# Patient Record
Sex: Female | Born: 1955
Health system: Southern US, Community
[De-identification: ages and names within clinical notes are randomized; demographics above are authoritative.]

## PROBLEM LIST (undated history)

## (undated) DIAGNOSIS — Z923 Personal history of irradiation: Secondary | ICD-10-CM

## (undated) DIAGNOSIS — C50919 Malignant neoplasm of unspecified site of unspecified female breast: Secondary | ICD-10-CM

## (undated) DIAGNOSIS — I10 Essential (primary) hypertension: Secondary | ICD-10-CM

## (undated) DIAGNOSIS — C801 Malignant (primary) neoplasm, unspecified: Secondary | ICD-10-CM

## (undated) DIAGNOSIS — M1990 Unspecified osteoarthritis, unspecified site: Secondary | ICD-10-CM

## (undated) DIAGNOSIS — IMO0002 Reserved for concepts with insufficient information to code with codable children: Secondary | ICD-10-CM

## (undated) HISTORY — DX: Reserved for concepts with insufficient information to code with codable children: IMO0002

## (undated) HISTORY — DX: Malignant (primary) neoplasm, unspecified: C80.1

## (undated) HISTORY — DX: Essential (primary) hypertension: I10

## (undated) HISTORY — PX: BREAST EXCISIONAL BIOPSY: SUR124

## (undated) HISTORY — PX: BREAST SURGERY: SHX581

## (undated) HISTORY — PX: COLPOSCOPY: SHX161

## (undated) HISTORY — DX: Unspecified osteoarthritis, unspecified site: M19.90

---

## 1997-03-10 DIAGNOSIS — Z923 Personal history of irradiation: Secondary | ICD-10-CM

## 1997-03-10 DIAGNOSIS — C801 Malignant (primary) neoplasm, unspecified: Secondary | ICD-10-CM

## 1997-03-10 DIAGNOSIS — C50919 Malignant neoplasm of unspecified site of unspecified female breast: Secondary | ICD-10-CM

## 1997-03-10 HISTORY — DX: Personal history of irradiation: Z92.3

## 1997-03-10 HISTORY — DX: Malignant (primary) neoplasm, unspecified: C80.1

## 1997-03-10 HISTORY — DX: Malignant neoplasm of unspecified site of unspecified female breast: C50.919

## 1997-12-22 ENCOUNTER — Encounter: Payer: Self-pay | Admitting: Surgery

## 1997-12-22 ENCOUNTER — Ambulatory Visit (HOSPITAL_COMMUNITY): Admission: RE | Admit: 1997-12-22 | Discharge: 1997-12-22 | Payer: Self-pay | Admitting: Surgery

## 1997-12-27 ENCOUNTER — Encounter: Admission: RE | Admit: 1997-12-27 | Discharge: 1998-03-27 | Payer: Self-pay | Admitting: Radiation Oncology

## 1998-05-16 ENCOUNTER — Ambulatory Visit (HOSPITAL_COMMUNITY): Admission: RE | Admit: 1998-05-16 | Discharge: 1998-05-16 | Payer: Self-pay | Admitting: Family Medicine

## 1998-12-19 ENCOUNTER — Encounter: Admission: RE | Admit: 1998-12-19 | Discharge: 1998-12-19 | Payer: Self-pay | Admitting: Family Medicine

## 1999-12-23 ENCOUNTER — Encounter: Payer: Self-pay | Admitting: Family Medicine

## 1999-12-23 ENCOUNTER — Encounter: Admission: RE | Admit: 1999-12-23 | Discharge: 1999-12-23 | Payer: Self-pay | Admitting: Family Medicine

## 2000-07-10 ENCOUNTER — Other Ambulatory Visit: Admission: RE | Admit: 2000-07-10 | Discharge: 2000-07-10 | Payer: Self-pay | Admitting: Family Medicine

## 2000-12-24 ENCOUNTER — Encounter: Payer: Self-pay | Admitting: Family Medicine

## 2000-12-24 ENCOUNTER — Encounter: Admission: RE | Admit: 2000-12-24 | Discharge: 2000-12-24 | Payer: Self-pay | Admitting: Family Medicine

## 2001-07-26 ENCOUNTER — Other Ambulatory Visit: Admission: RE | Admit: 2001-07-26 | Discharge: 2001-07-26 | Payer: Self-pay | Admitting: Family Medicine

## 2001-12-27 ENCOUNTER — Encounter: Payer: Self-pay | Admitting: Family Medicine

## 2001-12-27 ENCOUNTER — Encounter: Admission: RE | Admit: 2001-12-27 | Discharge: 2001-12-27 | Payer: Self-pay | Admitting: Family Medicine

## 2002-08-01 ENCOUNTER — Other Ambulatory Visit: Admission: RE | Admit: 2002-08-01 | Discharge: 2002-08-01 | Payer: Self-pay | Admitting: Family Medicine

## 2002-12-29 ENCOUNTER — Encounter: Admission: RE | Admit: 2002-12-29 | Discharge: 2002-12-29 | Payer: Self-pay | Admitting: Family Medicine

## 2002-12-29 ENCOUNTER — Encounter: Payer: Self-pay | Admitting: Family Medicine

## 2003-09-19 ENCOUNTER — Other Ambulatory Visit: Admission: RE | Admit: 2003-09-19 | Discharge: 2003-09-19 | Payer: Self-pay | Admitting: Family Medicine

## 2004-01-02 ENCOUNTER — Encounter: Admission: RE | Admit: 2004-01-02 | Discharge: 2004-01-02 | Payer: Self-pay | Admitting: Family Medicine

## 2004-12-16 ENCOUNTER — Other Ambulatory Visit: Admission: RE | Admit: 2004-12-16 | Discharge: 2004-12-16 | Payer: Self-pay | Admitting: Family Medicine

## 2005-01-06 ENCOUNTER — Encounter: Admission: RE | Admit: 2005-01-06 | Discharge: 2005-01-06 | Payer: Self-pay | Admitting: Family Medicine

## 2005-08-08 ENCOUNTER — Other Ambulatory Visit: Admission: RE | Admit: 2005-08-08 | Discharge: 2005-08-08 | Payer: Self-pay | Admitting: Family Medicine

## 2005-11-24 ENCOUNTER — Other Ambulatory Visit: Admission: RE | Admit: 2005-11-24 | Discharge: 2005-11-24 | Payer: Self-pay | Admitting: Family Medicine

## 2006-01-09 ENCOUNTER — Encounter: Admission: RE | Admit: 2006-01-09 | Discharge: 2006-01-09 | Payer: Self-pay | Admitting: Family Medicine

## 2006-06-29 ENCOUNTER — Other Ambulatory Visit: Admission: RE | Admit: 2006-06-29 | Discharge: 2006-06-29 | Payer: Self-pay | Admitting: Gynecology

## 2006-09-08 DIAGNOSIS — IMO0002 Reserved for concepts with insufficient information to code with codable children: Secondary | ICD-10-CM

## 2006-09-08 HISTORY — DX: Reserved for concepts with insufficient information to code with codable children: IMO0002

## 2006-12-23 ENCOUNTER — Other Ambulatory Visit: Admission: RE | Admit: 2006-12-23 | Discharge: 2006-12-23 | Payer: Self-pay | Admitting: Gynecology

## 2007-01-20 ENCOUNTER — Encounter: Admission: RE | Admit: 2007-01-20 | Discharge: 2007-01-20 | Payer: Self-pay | Admitting: Family Medicine

## 2007-06-17 ENCOUNTER — Ambulatory Visit: Payer: Self-pay | Admitting: Internal Medicine

## 2007-07-01 ENCOUNTER — Ambulatory Visit: Payer: Self-pay | Admitting: Internal Medicine

## 2007-07-28 ENCOUNTER — Other Ambulatory Visit: Admission: RE | Admit: 2007-07-28 | Discharge: 2007-07-28 | Payer: Self-pay | Admitting: Gynecology

## 2008-01-21 ENCOUNTER — Encounter: Admission: RE | Admit: 2008-01-21 | Discharge: 2008-01-21 | Payer: Self-pay | Admitting: Family Medicine

## 2008-02-01 ENCOUNTER — Encounter: Payer: Self-pay | Admitting: Gynecology

## 2008-02-01 ENCOUNTER — Other Ambulatory Visit: Admission: RE | Admit: 2008-02-01 | Discharge: 2008-02-01 | Payer: Self-pay | Admitting: Gynecology

## 2008-02-01 ENCOUNTER — Ambulatory Visit: Payer: Self-pay | Admitting: Gynecology

## 2009-01-22 ENCOUNTER — Encounter: Admission: RE | Admit: 2009-01-22 | Discharge: 2009-01-22 | Payer: Self-pay | Admitting: Internal Medicine

## 2009-05-14 ENCOUNTER — Ambulatory Visit: Payer: Self-pay | Admitting: Gynecology

## 2009-05-14 ENCOUNTER — Other Ambulatory Visit: Admission: RE | Admit: 2009-05-14 | Discharge: 2009-05-14 | Payer: Self-pay | Admitting: Gynecology

## 2010-01-07 ENCOUNTER — Ambulatory Visit: Payer: Self-pay | Admitting: Gynecology

## 2010-01-28 ENCOUNTER — Encounter: Admission: RE | Admit: 2010-01-28 | Discharge: 2010-01-28 | Payer: Self-pay | Admitting: Internal Medicine

## 2010-05-16 ENCOUNTER — Encounter (INDEPENDENT_AMBULATORY_CARE_PROVIDER_SITE_OTHER): Payer: BC Managed Care – PPO | Admitting: Gynecology

## 2010-05-16 ENCOUNTER — Other Ambulatory Visit: Payer: Self-pay | Admitting: Gynecology

## 2010-05-16 ENCOUNTER — Other Ambulatory Visit (HOSPITAL_COMMUNITY)
Admission: RE | Admit: 2010-05-16 | Discharge: 2010-05-16 | Disposition: A | Discharge status: Home / Self Care | Payer: BC Managed Care – PPO | Source: Ambulatory Visit | Attending: Gynecology | Admitting: Gynecology

## 2010-05-16 DIAGNOSIS — Z124 Encounter for screening for malignant neoplasm of cervix: Secondary | ICD-10-CM | POA: Insufficient documentation

## 2010-05-16 DIAGNOSIS — Z01419 Encounter for gynecological examination (general) (routine) without abnormal findings: Secondary | ICD-10-CM

## 2010-10-11 ENCOUNTER — Encounter: Payer: Self-pay | Admitting: *Deleted

## 2010-10-11 ENCOUNTER — Telehealth: Payer: Self-pay | Admitting: *Deleted

## 2010-10-11 NOTE — Telephone Encounter (Signed)
PT C/O UTI. PT INFORMED SHE NEED TO MAKE APPOINTMENT TO SEE MD.

## 2010-10-14 ENCOUNTER — Ambulatory Visit: Payer: BC Managed Care – PPO | Admitting: Gynecology

## 2010-12-30 ENCOUNTER — Other Ambulatory Visit: Payer: Self-pay | Admitting: Internal Medicine

## 2010-12-30 DIAGNOSIS — Z1231 Encounter for screening mammogram for malignant neoplasm of breast: Secondary | ICD-10-CM

## 2011-02-03 ENCOUNTER — Ambulatory Visit
Admission: RE | Admit: 2011-02-03 | Discharge: 2011-02-03 | Disposition: A | Discharge status: Home / Self Care | Payer: BC Managed Care – PPO | Source: Ambulatory Visit | Attending: Internal Medicine | Admitting: Internal Medicine

## 2011-02-03 DIAGNOSIS — Z1231 Encounter for screening mammogram for malignant neoplasm of breast: Secondary | ICD-10-CM

## 2011-05-26 ENCOUNTER — Encounter: Payer: Self-pay | Admitting: Gynecology

## 2011-05-26 ENCOUNTER — Encounter: Payer: BC Managed Care – PPO | Admitting: Gynecology

## 2011-05-26 ENCOUNTER — Other Ambulatory Visit (HOSPITAL_COMMUNITY)
Admission: RE | Admit: 2011-05-26 | Discharge: 2011-05-26 | Disposition: A | Discharge status: Home / Self Care | Payer: BC Managed Care – PPO | Source: Ambulatory Visit | Attending: Gynecology | Admitting: Gynecology

## 2011-05-26 ENCOUNTER — Ambulatory Visit (INDEPENDENT_AMBULATORY_CARE_PROVIDER_SITE_OTHER): Payer: BC Managed Care – PPO | Admitting: Gynecology

## 2011-05-26 VITALS — BP 112/74 | Ht 61.5 in | Wt 158.0 lb

## 2011-05-26 DIAGNOSIS — Z01419 Encounter for gynecological examination (general) (routine) without abnormal findings: Secondary | ICD-10-CM | POA: Insufficient documentation

## 2011-05-26 DIAGNOSIS — I10 Essential (primary) hypertension: Secondary | ICD-10-CM | POA: Insufficient documentation

## 2011-05-26 DIAGNOSIS — Z780 Asymptomatic menopausal state: Secondary | ICD-10-CM

## 2011-05-26 DIAGNOSIS — IMO0002 Reserved for concepts with insufficient information to code with codable children: Secondary | ICD-10-CM | POA: Insufficient documentation

## 2011-05-26 NOTE — Patient Instructions (Signed)
Follow up for bone density study as scheduled. Follow up in one year for your annual gynecologic exam.

## 2011-05-26 NOTE — Progress Notes (Signed)
Carrie Vaughn [birth year] [id number]        56 y.o.  for annual exam.  Several issues noted below.  Past medical history,surgical history, medications, allergies, family history and social history were all reviewed and documented in the EPIC chart. ROS:  Was performed and pertinent positives and negatives are included in the history.  Exam: Sherrilyn Rist chaperone present Filed Vitals:   05/26/11 1054  BP: 112/74   General appearance  Normal Skin grossly normal Head/Neck normal with no cervical or supraclavicular adenopathy thyroid normal Lungs  clear Cardiac RR, without RMG Abdominal  soft, nontender, without masses, organomegaly or hernia Breasts  examined lying and sitting without masses, retractions, discharge or axillary adenopathy. Pelvic  Ext/BUS/vagina  normal with atrophic genital changes  Cervix  normal  Pap done  Uterus  anteverted, normal size, shape and contour, midline and mobile nontender   Adnexa  Without masses or tenderness    Anus and perineum  normal   Rectovaginal  normal sphincter tone without palpated masses or tenderness.    Assessment/Plan:  56 y.o. female for annual exam.    1. Postmenopausal state. Patient is doing well no bleeding no significant menopausal symptoms. 2. History low-grade dysplasia. Patient had Pap smear showing low-grade SIL October 2008. Follow up colposcopy showed cervicitis with benign endocervical mucosa. Her follow up Pap smears in 2009 2011 2012 normal with a total of 4 normal Pap smears in a row.  Pap was done today. If normal will and continue annual cytology for now. May go to less frequent screening interval but will readdress on annual basis. 3. History of breast cancer. Mammography November 2012. She'll continue with annual mammography. SBE monthly reviewed. 4. Colonoscopy. She had her colonoscopy 4 years ago will follow up per their recommended schedule. 5. Bone density. Patient's never had a bone density I schedule a baseline now.  Increase calcium vitamin D reviewed. 6. Health maintenance. No blood work was done today as it's all been recently done through her primary physician's office who she sees on a regular basis.    Dara Lords MD, 11:15 AM 05/26/2011

## 2012-01-02 ENCOUNTER — Other Ambulatory Visit: Payer: Self-pay | Admitting: Internal Medicine

## 2012-01-02 DIAGNOSIS — Z1231 Encounter for screening mammogram for malignant neoplasm of breast: Secondary | ICD-10-CM

## 2012-02-16 ENCOUNTER — Ambulatory Visit
Admission: RE | Admit: 2012-02-16 | Discharge: 2012-02-16 | Disposition: A | Discharge status: Home / Self Care | Payer: BC Managed Care – PPO | Source: Ambulatory Visit | Attending: Internal Medicine | Admitting: Internal Medicine

## 2012-02-16 DIAGNOSIS — Z1231 Encounter for screening mammogram for malignant neoplasm of breast: Secondary | ICD-10-CM

## 2012-05-26 ENCOUNTER — Encounter: Payer: Self-pay | Admitting: Gynecology

## 2012-05-26 ENCOUNTER — Ambulatory Visit (INDEPENDENT_AMBULATORY_CARE_PROVIDER_SITE_OTHER): Payer: BC Managed Care – PPO | Admitting: Gynecology

## 2012-05-26 VITALS — BP 126/80 | Ht 61.0 in | Wt 155.0 lb

## 2012-05-26 DIAGNOSIS — Z780 Asymptomatic menopausal state: Secondary | ICD-10-CM

## 2012-05-26 NOTE — Progress Notes (Signed)
Carrie Vaughn 11/27/1955 [id number]        57 y.o.  G1P1001 for annual exam.  Doing well without complaints.  Past medical history,surgical history, medications, allergies, family history and social history were all reviewed and documented in the EPIC chart. ROS:  Was performed and pertinent positives and negatives are included in the history.  Exam: [name] assistant Filed Vitals:   05/26/12 1559  BP: 126/80  Height: 5\' 1"  (1.549 m)  Weight: 155 lb (70.308 kg)   General appearance  Normal Skin grossly normal Head/Neck normal with no cervical or supraclavicular adenopathy thyroid normal Lungs  clear Cardiac RR, without RMG Abdominal  soft, nontender, without masses, organomegaly or hernia Breasts  examined lying and sitting without masses, retractions, discharge or axillary adenopathy. Well-healed right lumpectomy scar. Pelvic  Ext/BUS/vagina  normal with mild atrophic changes  Cervix  normal   Uterus  anteverted, normal size, shape and contour, midline and mobile nontender   Adnexa  Without masses or tenderness    Anus and perineum  normal   Rectovaginal  normal sphincter tone without palpated masses or tenderness.    Assessment/Plan:  57 y.o. G42P1001 female for annual exam.   1. Postmenopausal. Doing well without significant symptoms such as hot flashes, night sweats vaginal dryness dyspareunia. Continue to monitor. 2. History of breast cancer. Doing well. Mammography 02/2012 normal. Continue with a no mammography. SBE monthly reviewed. 3. Pap smear 2013. No Pap smear done today.  Patient had Pap smear showing low-grade SIL October 2008. Follow up colposcopy showed cervicitis with benign endocervical mucosa. Had follow up Pap smears in 2009 2011 2012 2013 normal with a total of 5 normal Pap smears in a row. Plan every 3 year screening.  4. DEXA. Never followed up as recommended last year. Asked her to schedule baseline again this year and she agrees to do so. Increase calcium  vitamin D reviewed. 5. Colonoscopy 5 years ago. Plan repeat at 10-year interval. 6. Health maintenance. No blood work done as it is all done through her primary physician's office. Followup for DEXA otherwise 1 year, sooner as needed   Dara Lords MD, 4:37 PM 05/26/2012

## 2012-05-26 NOTE — Patient Instructions (Signed)
Followup for bone density as scheduled. Followup in one year for annual exam 

## 2013-01-12 ENCOUNTER — Other Ambulatory Visit: Payer: Self-pay

## 2013-01-12 DIAGNOSIS — Z1231 Encounter for screening mammogram for malignant neoplasm of breast: Secondary | ICD-10-CM

## 2013-02-16 ENCOUNTER — Ambulatory Visit
Admission: RE | Admit: 2013-02-16 | Discharge: 2013-02-16 | Disposition: A | Discharge status: Home / Self Care | Payer: BC Managed Care – PPO | Source: Ambulatory Visit

## 2013-02-16 DIAGNOSIS — Z1231 Encounter for screening mammogram for malignant neoplasm of breast: Secondary | ICD-10-CM

## 2013-05-27 ENCOUNTER — Encounter: Payer: BC Managed Care – PPO | Admitting: Gynecology

## 2013-07-06 ENCOUNTER — Ambulatory Visit (INDEPENDENT_AMBULATORY_CARE_PROVIDER_SITE_OTHER): Payer: BC Managed Care – PPO | Admitting: Gynecology

## 2013-07-06 ENCOUNTER — Encounter: Payer: Self-pay | Admitting: Gynecology

## 2013-07-06 VITALS — BP 120/76 | Ht 62.0 in | Wt 152.0 lb

## 2013-07-06 DIAGNOSIS — Z01419 Encounter for gynecological examination (general) (routine) without abnormal findings: Secondary | ICD-10-CM

## 2013-07-06 DIAGNOSIS — N952 Postmenopausal atrophic vaginitis: Secondary | ICD-10-CM

## 2013-07-06 NOTE — Patient Instructions (Signed)
Followup in one year, sooner as needed. Report any vaginal bleeding.  Health Maintenance, Female A healthy lifestyle and preventative care can promote health and wellness.  Maintain regular health, dental, and eye exams.  Eat a healthy diet. Foods like vegetables, fruits, whole grains, low-fat dairy products, and lean protein foods contain the nutrients you need without too many calories. Decrease your intake of foods high in solid fats, added sugars, and salt. Get information about a proper diet from your caregiver, if necessary.  Regular physical exercise is one of the most important things you can do for your health. Most adults should get at least 150 minutes of moderate-intensity exercise (any activity that increases your heart rate and causes you to sweat) each week. In addition, most adults need muscle-strengthening exercises on 2 or more days a week.   Maintain a healthy weight. The body mass index (BMI) is a screening tool to identify possible weight problems. It provides an estimate of body fat based on height and weight. Your caregiver can help determine your BMI, and can help you achieve or maintain a healthy weight. For adults 20 years and older:  A BMI below 18.5 is considered underweight.  A BMI of 18.5 to 24.9 is normal.  A BMI of 25 to 29.9 is considered overweight.  A BMI of 30 and above is considered obese.  Maintain normal blood lipids and cholesterol by exercising and minimizing your intake of saturated fat. Eat a balanced diet with plenty of fruits and vegetables. Blood tests for lipids and cholesterol should begin at age 47 and be repeated every 5 years. If your lipid or cholesterol levels are high, you are over 50, or you are a high risk for heart disease, you may need your cholesterol levels checked more frequently.Ongoing high lipid and cholesterol levels should be treated with medicines if diet and exercise are not effective.  If you smoke, find out from your  caregiver how to quit. If you do not use tobacco, do not start.  Lung cancer screening is recommended for adults aged 35 80 years who are at high risk for developing lung cancer because of a history of smoking. Yearly low-dose computed tomography (CT) is recommended for people who have at least a 30-pack-year history of smoking and are a current smoker or have quit within the past 15 years. A pack year of smoking is smoking an average of 1 pack of cigarettes a day for 1 year (for example: 1 pack a day for 30 years or 2 packs a day for 15 years). Yearly screening should continue until the smoker has stopped smoking for at least 15 years. Yearly screening should also be stopped for people who develop a health problem that would prevent them from having lung cancer treatment.  If you are pregnant, do not drink alcohol. If you are breastfeeding, be very cautious about drinking alcohol. If you are not pregnant and choose to drink alcohol, do not exceed 1 drink per day. One drink is considered to be 12 ounces (355 mL) of beer, 5 ounces (148 mL) of wine, or 1.5 ounces (44 mL) of liquor.  Avoid use of street drugs. Do not share needles with anyone. Ask for help if you need support or instructions about stopping the use of drugs.  High blood pressure causes heart disease and increases the risk of stroke. Blood pressure should be checked at least every 1 to 2 years. Ongoing high blood pressure should be treated with medicines, if weight  loss and exercise are not effective.  If you are 37 to 57 years old, ask your caregiver if you should take aspirin to prevent strokes.  Diabetes screening involves taking a blood sample to check your fasting blood sugar level. This should be done once every 3 years, after age 56, if you are within normal weight and without risk factors for diabetes. Testing should be considered at a younger age or be carried out more frequently if you are overweight and have at least 1 risk factor  for diabetes.  Breast cancer screening is essential preventative care for women. You should practice "breast self-awareness." This means understanding the normal appearance and feel of your breasts and may include breast self-examination. Any changes detected, no matter how small, should be reported to a caregiver. Women in their 51s and 30s should have a clinical breast exam (CBE) by a caregiver as part of a regular health exam every 1 to 3 years. After age 59, women should have a CBE every year. Starting at age 61, women should consider having a mammogram (breast X-ray) every year. Women who have a family history of breast cancer should talk to their caregiver about genetic screening. Women at a high risk of breast cancer should talk to their caregiver about having an MRI and a mammogram every year.  Breast cancer gene (BRCA)-related cancer risk assessment is recommended for women who have family members with BRCA-related cancers. BRCA-related cancers include breast, ovarian, tubal, and peritoneal cancers. Having family members with these cancers may be associated with an increased risk for harmful changes (mutations) in the breast cancer genes BRCA1 and BRCA2. Results of the assessment will determine the need for genetic counseling and BRCA1 and BRCA2 testing.  The Pap test is a screening test for cervical cancer. Women should have a Pap test starting at age 88. Between ages 43 and 24, Pap tests should be repeated every 2 years. Beginning at age 34, you should have a Pap test every 3 years as long as the past 3 Pap tests have been normal. If you had a hysterectomy for a problem that was not cancer or a condition that could lead to cancer, then you no longer need Pap tests. If you are between ages 93 and 29, and you have had normal Pap tests going back 10 years, you no longer need Pap tests. If you have had past treatment for cervical cancer or a condition that could lead to cancer, you need Pap tests and  screening for cancer for at least 20 years after your treatment. If Pap tests have been discontinued, risk factors (such as a new sexual partner) need to be reassessed to determine if screening should be resumed. Some women have medical problems that increase the chance of getting cervical cancer. In these cases, your caregiver may recommend more frequent screening and Pap tests.  The human papillomavirus (HPV) test is an additional test that may be used for cervical cancer screening. The HPV test looks for the virus that can cause the cell changes on the cervix. The cells collected during the Pap test can be tested for HPV. The HPV test could be used to screen women aged 34 years and older, and should be used in women of any age who have unclear Pap test results. After the age of 24, women should have HPV testing at the same frequency as a Pap test.  Colorectal cancer can be detected and often prevented. Most routine colorectal cancer screening begins at  the age of 41 and continues through age 76. However, your caregiver may recommend screening at an earlier age if you have risk factors for colon cancer. On a yearly basis, your caregiver may provide home test kits to check for hidden blood in the stool. Use of a small camera at the end of a tube, to directly examine the colon (sigmoidoscopy or colonoscopy), can detect the earliest forms of colorectal cancer. Talk to your caregiver about this at age 17, when routine screening begins. Direct examination of the colon should be repeated every 5 to 10 years through age 13, unless early forms of pre-cancerous polyps or small growths are found.  Hepatitis C blood testing is recommended for all people born from [birth year] through 1964 and any individual with known risks for hepatitis C.  Practice safe sex. Use condoms and avoid high-risk sexual practices to reduce the spread of sexually transmitted infections (STIs). Sexually active women aged 55 and younger should be  checked for Chlamydia, which is a common sexually transmitted infection. Older women with new or multiple partners should also be tested for Chlamydia. Testing for other STIs is recommended if you are sexually active and at increased risk.  Osteoporosis is a disease in which the bones lose minerals and strength with aging. This can result in serious bone fractures. The risk of osteoporosis can be identified using a bone density scan. Women ages 66 and over and women at risk for fractures or osteoporosis should discuss screening with their caregivers. Ask your caregiver whether you should be taking a calcium supplement or vitamin D to reduce the rate of osteoporosis.  Menopause can be associated with physical symptoms and risks. Hormone replacement therapy is available to decrease symptoms and risks. You should talk to your caregiver about whether hormone replacement therapy is right for you.  Use sunscreen. Apply sunscreen liberally and repeatedly throughout the day. You should seek shade when your shadow is shorter than you. Protect yourself by wearing long sleeves, pants, a wide-brimmed hat, and sunglasses year round, whenever you are outdoors.  Notify your caregiver of new moles or changes in moles, especially if there is a change in shape or color. Also notify your caregiver if a mole is larger than the size of a pencil eraser.  Stay current with your immunizations. Document Released: 09/09/2010 Document Revised: 06/21/2012 Document Reviewed: 09/09/2010 [facility] Patient Information 2014 [employer].

## 2013-07-06 NOTE — Progress Notes (Signed)
Carrie Vaughn 1955-06-30 [id number]        58 y.o.  G1P1001 for annual exam.  Doing well. Several issues noted below.  Past medical history,surgical history, problem list, medications, allergies, family history and social history were all reviewed and documented as reviewed in the EPIC chart.  ROS:  12 system ROS performed with pertinent positives and negatives included in the history, assessment and plan.  Included Systems: General, HEENT, Neck, Cardiovascular, Pulmonary, Gastrointestinal, Genitourinary, Musculoskeletal, Dermatologic, Endocrine, Hematological, Neurologic, Psychiatric Additional significant findings : None   Exam: [name] assistant Filed Vitals:   07/06/13 1529  BP: 120/76  Height: 5\' 2"  (1.575 m)  Weight: 152 lb (68.947 kg)   General appearance:  Normal affect, orientation and appearance. Skin: Grossly normal HEENT: Without gross lesions.  No cervical or supraclavicular adenopathy. Thyroid normal.  Lungs:  Clear without wheezing, rales or rhonchi Cardiac: RR, without RMG Abdominal:  Soft, nontender, without masses, guarding, rebound, organomegaly or hernia Breasts:  Examined lying and sitting without masses, retractions, discharge or axillary adenopathy. Well-healed right lumpectomy scar Pelvic:  Ext/BUS/vagina with generalized atrophic changes.  Cervix normal with atrophic changes  Uterus anteverted, normal size, shape and contour, midline and mobile nontender   Adnexa  Without masses or tenderness    Anus and perineum  Normal   Rectovaginal  Normal sphincter tone without palpated masses or tenderness.    Assessment/Plan:  58 y.o. G39P1001 female for annual exam.   1. Postmenopausal/atrophic genital changes. No significant hot flashes, night sweats, vaginal dryness or dyspareunia. No vaginal bleeding. Will continue to monitor. Patient knows to report any vaginal bleeding. 2. History of right breast cancer status post radiation treatment. Never received  chemotherapy or tamoxifen/Femara exam NED. Mammography 02/2013. Continue with annual mammography. 3. Pap smear 2013. No Pap smear done today. History of LGSIL 2008 with normal Pap smears afterwards. Plan repeat Pap smear next year 3 year interval. 4. DEXA never. I discussed scheduling it last year but she never followed up for this. She has no family history of osteoporosis. She is active. Never took chemotherapy or anti-estrogen medication. We both agree now to schedule at age 52. Increase calcium vitamin D reviewed. 5. Colonoscopy 6 years ago with reported recommended repeat interval 10 years. 6. Health maintenance. No blood work done as this is done through her primary physician's office. Followup one year, sooner as needed.    Note: This document was prepared with digital dictation and possible smart phrase technology. Any transcriptional errors that result from this process are unintentional.   [name], 3:51 PM 07/06/2013

## 2013-07-07 LAB — URINALYSIS W MICROSCOPIC + REFLEX CULTURE
Bilirubin Urine: NEGATIVE
CASTS: NONE SEEN
CRYSTALS: NONE SEEN
GLUCOSE, UA: NEGATIVE mg/dL
HGB URINE DIPSTICK: NEGATIVE
KETONES UR: NEGATIVE mg/dL
Nitrite: NEGATIVE
PH: 6 (ref 5.0–8.0)
Protein, ur: NEGATIVE mg/dL
SPECIFIC GRAVITY, URINE: 1.007 (ref 1.005–1.030)
Squamous Epithelial / LPF: NONE SEEN
Urobilinogen, UA: 0.2 mg/dL (ref 0.0–1.0)

## 2013-07-08 LAB — URINE CULTURE

## 2014-01-09 ENCOUNTER — Other Ambulatory Visit: Payer: Self-pay

## 2014-01-09 ENCOUNTER — Encounter: Payer: Self-pay | Admitting: Gynecology

## 2014-01-09 DIAGNOSIS — Z1231 Encounter for screening mammogram for malignant neoplasm of breast: Secondary | ICD-10-CM

## 2014-02-17 ENCOUNTER — Ambulatory Visit
Admission: RE | Admit: 2014-02-17 | Discharge: 2014-02-17 | Disposition: A | Discharge status: Home / Self Care | Payer: BC Managed Care – PPO | Source: Ambulatory Visit

## 2014-02-17 ENCOUNTER — Encounter (INDEPENDENT_AMBULATORY_CARE_PROVIDER_SITE_OTHER): Payer: Self-pay

## 2014-02-17 DIAGNOSIS — Z1231 Encounter for screening mammogram for malignant neoplasm of breast: Secondary | ICD-10-CM

## 2014-03-17 ENCOUNTER — Encounter: Payer: Self-pay | Admitting: Internal Medicine

## 2014-08-16 ENCOUNTER — Encounter: Payer: Self-pay | Admitting: Gynecology

## 2014-09-01 ENCOUNTER — Ambulatory Visit (INDEPENDENT_AMBULATORY_CARE_PROVIDER_SITE_OTHER): Payer: BLUE CROSS/BLUE SHIELD | Admitting: Gynecology

## 2014-09-01 ENCOUNTER — Encounter: Payer: Self-pay | Admitting: Gynecology

## 2014-09-01 ENCOUNTER — Other Ambulatory Visit (HOSPITAL_COMMUNITY)
Admission: RE | Admit: 2014-09-01 | Discharge: 2014-09-01 | Disposition: A | Discharge status: Home / Self Care | Payer: BLUE CROSS/BLUE SHIELD | Source: Ambulatory Visit | Attending: Gynecology | Admitting: Gynecology

## 2014-09-01 VITALS — BP 124/76 | Ht 62.5 in | Wt 158.0 lb

## 2014-09-01 DIAGNOSIS — Z01419 Encounter for gynecological examination (general) (routine) without abnormal findings: Secondary | ICD-10-CM | POA: Diagnosis not present

## 2014-09-01 NOTE — Patient Instructions (Signed)
You may obtain a copy of any labs that were done today by logging onto MyChart as outlined in the instructions provided with your AVS (after visit summary). The office will not call with normal lab results but certainly if there are any significant abnormalities then we will contact you.   Health Maintenance, Female A healthy lifestyle and preventative care can promote health and wellness.  Maintain regular health, dental, and eye exams.  Eat a healthy diet. Foods like vegetables, fruits, whole grains, low-fat dairy products, and lean protein foods contain the nutrients you need without too many calories. Decrease your intake of foods high in solid fats, added sugars, and salt. Get information about a proper diet from your caregiver, if necessary.  Regular physical exercise is one of the most important things you can do for your health. Most adults should get at least 150 minutes of moderate-intensity exercise (any activity that increases your heart rate and causes you to sweat) each week. In addition, most adults need muscle-strengthening exercises on 2 or more days a week.   Maintain a healthy weight. The body mass index (BMI) is a screening tool to identify possible weight problems. It provides an estimate of body fat based on height and weight. Your caregiver can help determine your BMI, and can help you achieve or maintain a healthy weight. For adults 20 years and older:  A BMI below 18.5 is considered underweight.  A BMI of 18.5 to 24.9 is normal.  A BMI of 25 to 29.9 is considered overweight.  A BMI of 30 and above is considered obese.  Maintain normal blood lipids and cholesterol by exercising and minimizing your intake of saturated fat. Eat a balanced diet with plenty of fruits and vegetables. Blood tests for lipids and cholesterol should begin at age 61 and be repeated every 5 years. If your lipid or cholesterol levels are high, you are over 50, or you are a high risk for heart  disease, you may need your cholesterol levels checked more frequently.Ongoing high lipid and cholesterol levels should be treated with medicines if diet and exercise are not effective.  If you smoke, find out from your caregiver how to quit. If you do not use tobacco, do not start.  Lung cancer screening is recommended for adults aged 33 80 years who are at high risk for developing lung cancer because of a history of smoking. Yearly low-dose computed tomography (CT) is recommended for people who have at least a 30-pack-year history of smoking and are a current smoker or have quit within the past 15 years. A pack year of smoking is smoking an average of 1 pack of cigarettes a day for 1 year (for example: 1 pack a day for 30 years or 2 packs a day for 15 years). Yearly screening should continue until the smoker has stopped smoking for at least 15 years. Yearly screening should also be stopped for people who develop a health problem that would prevent them from having lung cancer treatment.  If you are pregnant, do not drink alcohol. If you are breastfeeding, be very cautious about drinking alcohol. If you are not pregnant and choose to drink alcohol, do not exceed 1 drink per day. One drink is considered to be 12 ounces (355 mL) of beer, 5 ounces (148 mL) of wine, or 1.5 ounces (44 mL) of liquor.  Avoid use of street drugs. Do not share needles with anyone. Ask for help if you need support or instructions about stopping  the use of drugs.  High blood pressure causes heart disease and increases the risk of stroke. Blood pressure should be checked at least every 1 to 2 years. Ongoing high blood pressure should be treated with medicines, if weight loss and exercise are not effective.  If you are 59 to 58 years old, ask your caregiver if you should take aspirin to prevent strokes.  Diabetes screening involves taking a blood sample to check your fasting blood sugar level. This should be done once every 3  years, after age 91, if you are within normal weight and without risk factors for diabetes. Testing should be considered at a younger age or be carried out more frequently if you are overweight and have at least 1 risk factor for diabetes.  Breast cancer screening is essential preventative care for women. You should practice "breast self-awareness." This means understanding the normal appearance and feel of your breasts and may include breast self-examination. Any changes detected, no matter how small, should be reported to a caregiver. Women in their 66s and 30s should have a clinical breast exam (CBE) by a caregiver as part of a regular health exam every 1 to 3 years. After age 101, women should have a CBE every year. Starting at age 100, women should consider having a mammogram (breast X-ray) every year. Women who have a family history of breast cancer should talk to their caregiver about genetic screening. Women at a high risk of breast cancer should talk to their caregiver about having an MRI and a mammogram every year.  Breast cancer gene (BRCA)-related cancer risk assessment is recommended for women who have family members with BRCA-related cancers. BRCA-related cancers include breast, ovarian, tubal, and peritoneal cancers. Having family members with these cancers may be associated with an increased risk for harmful changes (mutations) in the breast cancer genes BRCA1 and BRCA2. Results of the assessment will determine the need for genetic counseling and BRCA1 and BRCA2 testing.  The Pap test is a screening test for cervical cancer. Women should have a Pap test starting at age 57. Between ages 25 and 35, Pap tests should be repeated every 2 years. Beginning at age 37, you should have a Pap test every 3 years as long as the past 3 Pap tests have been normal. If you had a hysterectomy for a problem that was not cancer or a condition that could lead to cancer, then you no longer need Pap tests. If you are  between ages 50 and 76, and you have had normal Pap tests going back 10 years, you no longer need Pap tests. If you have had past treatment for cervical cancer or a condition that could lead to cancer, you need Pap tests and screening for cancer for at least 20 years after your treatment. If Pap tests have been discontinued, risk factors (such as a new sexual partner) need to be reassessed to determine if screening should be resumed. Some women have medical problems that increase the chance of getting cervical cancer. In these cases, your caregiver may recommend more frequent screening and Pap tests.  The human papillomavirus (HPV) test is an additional test that may be used for cervical cancer screening. The HPV test looks for the virus that can cause the cell changes on the cervix. The cells collected during the Pap test can be tested for HPV. The HPV test could be used to screen women aged 44 years and older, and should be used in women of any age  who have unclear Pap test results. After the age of 55, women should have HPV testing at the same frequency as a Pap test.  Colorectal cancer can be detected and often prevented. Most routine colorectal cancer screening begins at the age of 44 and continues through age 20. However, your caregiver may recommend screening at an earlier age if you have risk factors for colon cancer. On a yearly basis, your caregiver may provide home test kits to check for hidden blood in the stool. Use of a small camera at the end of a tube, to directly examine the colon (sigmoidoscopy or colonoscopy), can detect the earliest forms of colorectal cancer. Talk to your caregiver about this at age 86, when routine screening begins. Direct examination of the colon should be repeated every 5 to 10 years through age 13, unless early forms of pre-cancerous polyps or small growths are found.  Hepatitis C blood testing is recommended for all people born from [birth year] through 1964 and any  individual with known risks for hepatitis C.  Practice safe sex. Use condoms and avoid high-risk sexual practices to reduce the spread of sexually transmitted infections (STIs). Sexually active women aged 36 and younger should be checked for Chlamydia, which is a common sexually transmitted infection. Older women with new or multiple partners should also be tested for Chlamydia. Testing for other STIs is recommended if you are sexually active and at increased risk.  Osteoporosis is a disease in which the bones lose minerals and strength with aging. This can result in serious bone fractures. The risk of osteoporosis can be identified using a bone density scan. Women ages 20 and over and women at risk for fractures or osteoporosis should discuss screening with their caregivers. Ask your caregiver whether you should be taking a calcium supplement or vitamin D to reduce the rate of osteoporosis.  Menopause can be associated with physical symptoms and risks. Hormone replacement therapy is available to decrease symptoms and risks. You should talk to your caregiver about whether hormone replacement therapy is right for you.  Use sunscreen. Apply sunscreen liberally and repeatedly throughout the day. You should seek shade when your shadow is shorter than you. Protect yourself by wearing long sleeves, pants, a wide-brimmed hat, and sunglasses year round, whenever you are outdoors.  Notify your caregiver of new moles or changes in moles, especially if there is a change in shape or color. Also notify your caregiver if a mole is larger than the size of a pencil eraser.  Stay current with your immunizations. Document Released: 09/09/2010 Document Revised: 06/21/2012 Document Reviewed: 09/09/2010 [facility] Patient Information 2014 [employer].

## 2014-09-01 NOTE — Progress Notes (Signed)
Carrie Vaughn 1956-01-22 [id number]        59 y.o.  G1P1001 for annual exam.  Doing well without complaints  Past medical history,surgical history, problem list, medications, allergies, family history and social history were all reviewed and documented as reviewed in the EPIC chart.  ROS:  Performed with pertinent positives and negatives included in the history, assessment and plan.   Additional significant findings :  none   Exam: [name] Counsellor Vitals:   09/01/14 1625  BP: 124/76  Height: 5' 2.5" (1.588 m)  Weight: 158 lb (71.668 kg)   General appearance:  Normal affect, orientation and appearance. Skin: Grossly normal HEENT: Without gross lesions.  No cervical or supraclavicular adenopathy. Thyroid normal.  Lungs:  Clear without wheezing, rales or rhonchi Cardiac: RR, without RMG Abdominal:  Soft, nontender, without masses, guarding, rebound, organomegaly or hernia Breasts:  Examined lying and sitting without masses, retractions, discharge or axillary adenopathy.  Right breast with well-healed lumpectomy scar. Pelvic:  Ext/BUS/vagina with atrophic changes  Cervix with atrophic changes. Pap smear done  Uterus anteverted, normal size, shape and contour, midline and mobile nontender   Adnexa  Without masses or tenderness    Anus and perineum  Normal   Rectovaginal  Normal sphincter tone without palpated masses or tenderness.    Assessment/Plan:  59 y.o. G55P1001 female for annual exam.   1. Postmenopausal/atrophic genital changes. No significant hot flushes, night sweats, vaginal dryness or any vaginal bleeding. Continue to monitor report any vaginal bleeding. 2. History of right breast cancer status post lumpectomy and radiation.  Exam NED. Mammography 02/2014. Follow up mammogram end of this year. SBE monthly reviewed. 3. Pap smear 2013. Pap smear done today. History of LGSIL 2008 with normal Pap smears afterwards. Continue with every 3 year screening. 4. Colonoscopy  2009 with reported repeat interval 10 years. 5. DEXA never. Plan Baseline DEXA at age 55. Increased calcium vitamin D reviewed. 6. Health maintenance. No routine blood work done as patient reports is done at her primary physician's office. Follow up in one year, sooner as needed.   [name], 4:44 PM 09/01/2014

## 2014-09-02 LAB — URINALYSIS W MICROSCOPIC + REFLEX CULTURE
Bacteria, UA: NONE SEEN
Bilirubin Urine: NEGATIVE
Casts: NONE SEEN
Crystals: NONE SEEN
Glucose, UA: NEGATIVE mg/dL
Hgb urine dipstick: NEGATIVE
Ketones, ur: NEGATIVE mg/dL
Leukocytes, UA: NEGATIVE
Nitrite: NEGATIVE
Protein, ur: NEGATIVE mg/dL
Specific Gravity, Urine: 1.006 (ref 1.005–1.030)
Squamous Epithelial / HPF: NONE SEEN
Urobilinogen, UA: 0.2 mg/dL (ref 0.0–1.0)
pH: 5.5 (ref 5.0–8.0)

## 2014-09-06 LAB — CYTOLOGY - PAP

## 2015-01-22 ENCOUNTER — Other Ambulatory Visit: Payer: Self-pay

## 2015-01-22 DIAGNOSIS — Z1231 Encounter for screening mammogram for malignant neoplasm of breast: Secondary | ICD-10-CM

## 2015-02-22 ENCOUNTER — Ambulatory Visit: Payer: BLUE CROSS/BLUE SHIELD

## 2015-03-23 ENCOUNTER — Ambulatory Visit
Admission: RE | Admit: 2015-03-23 | Discharge: 2015-03-23 | Disposition: A | Discharge status: Home / Self Care | Payer: BLUE CROSS/BLUE SHIELD | Source: Ambulatory Visit

## 2015-03-23 DIAGNOSIS — Z1231 Encounter for screening mammogram for malignant neoplasm of breast: Secondary | ICD-10-CM

## 2015-07-13 ENCOUNTER — Encounter: Payer: Self-pay | Admitting: Gynecology

## 2015-09-03 ENCOUNTER — Ambulatory Visit (INDEPENDENT_AMBULATORY_CARE_PROVIDER_SITE_OTHER): Payer: BLUE CROSS/BLUE SHIELD | Admitting: Gynecology

## 2015-09-03 ENCOUNTER — Encounter: Payer: Self-pay | Admitting: Gynecology

## 2015-09-03 VITALS — BP 118/76 | Ht 62.0 in | Wt 158.0 lb

## 2015-09-03 DIAGNOSIS — N952 Postmenopausal atrophic vaginitis: Secondary | ICD-10-CM

## 2015-09-03 DIAGNOSIS — Z01419 Encounter for gynecological examination (general) (routine) without abnormal findings: Secondary | ICD-10-CM | POA: Diagnosis not present

## 2015-09-03 NOTE — Patient Instructions (Signed)

## 2015-09-03 NOTE — Progress Notes (Signed)
    Carrie Vaughn 1955-06-16 [id number]        60 y.o.  G1P1001  for annual exam.  Doing well without complaints  Past medical history,surgical history, problem list, medications, allergies, family history and social history were all reviewed and documented as reviewed in the EPIC chart.  ROS:  Performed with pertinent positives and negatives included in the history, assessment and plan.   Additional significant findings :  None   Exam: [name] Filed Vitals:   09/03/15 1337  BP: 118/76  Height: 5\' 2"  (1.575 m)  Weight: 158 lb (71.668 kg)   General appearance:  Normal affect, orientation and appearance. Skin: Grossly normal HEENT: Without gross lesions.  No cervical or supraclavicular adenopathy. Thyroid normal.  Lungs:  Clear without wheezing, rales or rhonchi Cardiac: RR, without RMG Abdominal:  Soft, nontender, without masses, guarding, rebound, organomegaly or hernia Breasts:  Examined lying and sitting without masses, retractions, discharge or axillary adenopathy.  Well-healed right lumpectomy scar Pelvic:  Ext/BUS/vagina with mild atrophic changes  Cervix with atrophic changes  Uterus anteverted, normal size, shape and contour, midline and mobile nontender   Adnexa without masses or tenderness    Anus and perineum normal   Rectovaginal normal sphincter tone without palpated masses or tenderness.    Assessment/Plan:  60 y.o. G61P1001 female for annual exam doing well.   1. Postmenopausal/atrophic genital changes. No significant hot flushes, night sweats, vaginal dryness or any vaginal bleeding. Continue monitor report any issues or vaginal bleeding. 2. History of right breast cancer status post lumpectomy and radiation. Exam NED. Mammography 03/2015. Continue with annual mammography. SBE monthly reviewed. 3. Pap smear 08/2014. No Pap smear done today. History of LGSIL 2008 with normal Pap smears afterwards. Plan repeat Pap smear at 3 year interval per current  screening guidelines. 4. Colonoscopy 2009. Repeat at their recommended interval. 5. DEXA this past year at her medical physician's office. Unsure of the results. I asked her to call their office to just verify that it was normal. 6. Health maintenance. No routine lab work done as this is done at her primary physician's office. Follow up 1 year, sooner as needed.   [name], 1:50 PM 09/03/2015

## 2016-02-27 ENCOUNTER — Other Ambulatory Visit: Payer: Self-pay | Admitting: Internal Medicine

## 2016-02-27 DIAGNOSIS — Z1231 Encounter for screening mammogram for malignant neoplasm of breast: Secondary | ICD-10-CM

## 2016-03-27 ENCOUNTER — Ambulatory Visit: Payer: BLUE CROSS/BLUE SHIELD

## 2016-04-18 ENCOUNTER — Ambulatory Visit
Admission: RE | Admit: 2016-04-18 | Discharge: 2016-04-18 | Disposition: A | Discharge status: Home / Self Care | Payer: BLUE CROSS/BLUE SHIELD | Source: Ambulatory Visit | Attending: Internal Medicine | Admitting: Internal Medicine

## 2016-04-18 DIAGNOSIS — Z1231 Encounter for screening mammogram for malignant neoplasm of breast: Secondary | ICD-10-CM

## 2016-04-18 HISTORY — DX: Malignant neoplasm of unspecified site of unspecified female breast: C50.919

## 2016-04-18 HISTORY — DX: Personal history of irradiation: Z92.3

## 2016-09-03 ENCOUNTER — Encounter: Payer: BLUE CROSS/BLUE SHIELD | Admitting: Gynecology

## 2016-09-08 ENCOUNTER — Encounter: Payer: BLUE CROSS/BLUE SHIELD | Admitting: Gynecology

## 2016-09-12 ENCOUNTER — Encounter: Payer: Self-pay | Admitting: Gynecology

## 2016-09-12 ENCOUNTER — Ambulatory Visit (INDEPENDENT_AMBULATORY_CARE_PROVIDER_SITE_OTHER): Payer: BLUE CROSS/BLUE SHIELD | Admitting: Gynecology

## 2016-09-12 VITALS — BP 114/70 | Ht 62.0 in | Wt 158.0 lb

## 2016-09-12 DIAGNOSIS — C50911 Malignant neoplasm of unspecified site of right female breast: Secondary | ICD-10-CM | POA: Diagnosis not present

## 2016-09-12 DIAGNOSIS — N952 Postmenopausal atrophic vaginitis: Secondary | ICD-10-CM | POA: Diagnosis not present

## 2016-09-12 DIAGNOSIS — Z01411 Encounter for gynecological examination (general) (routine) with abnormal findings: Secondary | ICD-10-CM | POA: Diagnosis not present

## 2016-09-12 NOTE — Patient Instructions (Signed)
Follow up in one year, sooner if any issues 

## 2016-09-12 NOTE — Progress Notes (Signed)
    MASSA PE [birth year] [id number]        61 y.o.  G1P1001 for annual exam.    Past medical history,surgical history, problem list, medications, allergies, family history and social history were all reviewed and documented as reviewed in the EPIC chart.  ROS:  Performed with pertinent positives and negatives included in the history, assessment and plan.   Additional significant findings :  None   Exam: [name] Vitals:   09/12/16 1132  BP: 114/70  Weight: 158 lb (71.7 kg)  Height: 5\' 2"  (1.575 m)   Body mass index is 28.9 kg/m.  General appearance:  Normal affect, orientation and appearance. Skin: Grossly normal HEENT: Without gross lesions.  No cervical or supraclavicular adenopathy. Thyroid normal.  Lungs:  Clear without wheezing, rales or rhonchi Cardiac: RR, without RMG Abdominal:  Soft, nontender, without masses, guarding, rebound, organomegaly or hernia Breasts:  Examined lying and sitting without masses, retractions, discharge or axillary adenopathy.  Well-healed right lumpectomy scar Pelvic:  Ext, BUS, Vagina: With atrophic changes  Cervix: With atrophic changes  Uterus: Anteverted, normal size, shape and contour, midline and mobile nontender   Adnexa: Without masses or tenderness    Anus and perineum: Normal   Rectovaginal: Normal sphincter tone without palpated masses or tenderness.    Assessment/Plan:  61 y.o. G54P1001 female for annual exam.   1. Postmenopausal/atrophic genital changes. No significant hot flushes, night sweats, vaginal dryness or any vaginal bleeding. Continue to monitor and report any issues or bleeding. 2. History of right breast cancer status post lumpectomy and radiation. Exam NED. Mammography 04/2016. Continue with annual mammography when due. 3. Pap smear 08/2014. No Pap smear done today. History of LGSIL 2008 with normal Pap smears afterwards. Plan repeat Pap smear next year at 3 year interval current screening  guidelines. 4. Colonoscopy coming due next year and I reminded her to schedule this. 5. DEXA reported 2016 at her primary physician's office. She'll continue to follow up with them in reference to bone health. 6. Health maintenance. No routine lab work done as patient does this elsewhere. Follow up 1 year, sooner as needed.   [name], 11:50 AM 09/12/2016

## 2016-10-10 ENCOUNTER — Encounter: Payer: BLUE CROSS/BLUE SHIELD | Admitting: Gynecology

## 2017-02-12 DIAGNOSIS — M170 Bilateral primary osteoarthritis of knee: Secondary | ICD-10-CM | POA: Diagnosis not present

## 2017-02-12 DIAGNOSIS — R768 Other specified abnormal immunological findings in serum: Secondary | ICD-10-CM | POA: Diagnosis not present

## 2017-02-12 DIAGNOSIS — M2550 Pain in unspecified joint: Secondary | ICD-10-CM | POA: Diagnosis not present

## 2017-02-12 DIAGNOSIS — M25569 Pain in unspecified knee: Secondary | ICD-10-CM | POA: Diagnosis not present

## 2017-03-16 ENCOUNTER — Other Ambulatory Visit: Payer: Self-pay | Admitting: Internal Medicine

## 2017-03-16 DIAGNOSIS — Z1231 Encounter for screening mammogram for malignant neoplasm of breast: Secondary | ICD-10-CM

## 2017-04-20 ENCOUNTER — Ambulatory Visit
Admission: RE | Admit: 2017-04-20 | Discharge: 2017-04-20 | Disposition: A | Discharge status: Home / Self Care | Payer: BLUE CROSS/BLUE SHIELD | Source: Ambulatory Visit | Attending: Internal Medicine | Admitting: Internal Medicine

## 2017-04-20 DIAGNOSIS — Z1231 Encounter for screening mammogram for malignant neoplasm of breast: Secondary | ICD-10-CM | POA: Diagnosis not present

## 2017-06-08 ENCOUNTER — Encounter: Payer: Self-pay | Admitting: Internal Medicine

## 2017-06-18 ENCOUNTER — Encounter: Payer: Self-pay | Admitting: Internal Medicine

## 2017-06-29 DIAGNOSIS — Z0000 Encounter for general adult medical examination without abnormal findings: Secondary | ICD-10-CM | POA: Diagnosis not present

## 2017-06-29 DIAGNOSIS — I10 Essential (primary) hypertension: Secondary | ICD-10-CM | POA: Diagnosis not present

## 2017-07-06 DIAGNOSIS — M170 Bilateral primary osteoarthritis of knee: Secondary | ICD-10-CM | POA: Diagnosis not present

## 2017-07-06 DIAGNOSIS — I10 Essential (primary) hypertension: Secondary | ICD-10-CM | POA: Diagnosis not present

## 2017-07-06 DIAGNOSIS — E7849 Other hyperlipidemia: Secondary | ICD-10-CM | POA: Diagnosis not present

## 2017-07-06 DIAGNOSIS — R82998 Other abnormal findings in urine: Secondary | ICD-10-CM | POA: Diagnosis not present

## 2017-07-06 DIAGNOSIS — Z1389 Encounter for screening for other disorder: Secondary | ICD-10-CM | POA: Diagnosis not present

## 2017-07-06 DIAGNOSIS — J3089 Other allergic rhinitis: Secondary | ICD-10-CM | POA: Diagnosis not present

## 2017-07-06 DIAGNOSIS — Z0000 Encounter for general adult medical examination without abnormal findings: Secondary | ICD-10-CM | POA: Diagnosis not present

## 2017-07-21 ENCOUNTER — Telehealth: Payer: Self-pay

## 2017-07-21 NOTE — Telephone Encounter (Signed)
Pt had a 2:00 pre-visit appointment. Pt did not show, called home and cell number and left a voicemail. Advised to call back before 5 to keep procedure appointment as schedule.

## 2017-08-04 ENCOUNTER — Encounter: Payer: BLUE CROSS/BLUE SHIELD | Admitting: Internal Medicine

## 2017-09-17 ENCOUNTER — Ambulatory Visit (INDEPENDENT_AMBULATORY_CARE_PROVIDER_SITE_OTHER): Payer: BLUE CROSS/BLUE SHIELD | Admitting: Gynecology

## 2017-09-17 ENCOUNTER — Encounter: Payer: Self-pay | Admitting: Gynecology

## 2017-09-17 VITALS — BP 120/74 | Ht 61.5 in | Wt 154.0 lb

## 2017-09-17 DIAGNOSIS — Z853 Personal history of malignant neoplasm of breast: Secondary | ICD-10-CM

## 2017-09-17 DIAGNOSIS — N952 Postmenopausal atrophic vaginitis: Secondary | ICD-10-CM

## 2017-09-17 DIAGNOSIS — Z01419 Encounter for gynecological examination (general) (routine) without abnormal findings: Secondary | ICD-10-CM

## 2017-09-17 NOTE — Patient Instructions (Signed)
Follow-up in 1 year, sooner as needed. 

## 2017-09-17 NOTE — Progress Notes (Signed)
    Carrie Vaughn 02-Oct-1955 [id number]        61 y.o.  G1P1001 for annual gynecologic exam.  Without gynecologic complaints  Past medical history,surgical history, problem list, medications, allergies, family history and social history were all reviewed and documented as reviewed in the EPIC chart.  ROS:  Performed with pertinent positives and negatives included in the history, assessment and plan.   Additional significant findings : None   Exam: [name] Vitals:   09/17/17 1601  BP: 120/74  Weight: 154 lb (69.9 kg)  Height: 5' 1.5" (1.562 m)   Body mass index is 28.63 kg/m.  General appearance:  Normal affect, orientation and appearance. Skin: Grossly normal HEENT: Without gross lesions.  No cervical or supraclavicular adenopathy. Thyroid normal.  Lungs:  Clear without wheezing, rales or rhonchi Cardiac: RR, without RMG Abdominal:  Soft, nontender, without masses, guarding, rebound, organomegaly or hernia Breasts:  Examined lying and sitting without masses, retractions, discharge or axillary adenopathy.  Well-healed right lumpectomy scar Pelvic:  Ext, BUS, Vagina: With atrophic changes  Cervix: With atrophic changes.  Pap smear done  Uterus: Anteverted, normal size, shape and contour, midline and mobile nontender   Adnexa: Without masses or tenderness    Anus and perineum: Normal   Rectovaginal: Normal sphincter tone without palpated masses or tenderness.    Assessment/Plan:  62 y.o. G51P1001 female for annual gynecologic exam.   1. Postmenopausal/atrophic genital changes.  No significant menopausal symptoms or any bleeding.  Continue to monitor and report any issues or bleeding. 2. History of right breast cancer status post lumpectomy and radiation.  Exam NED.  Mammography 04/2017. 3. Pap smear 2016.  Pap smear done today.  History of LGSIL 2008 with normal Pap smears since. 4. Colonoscopy due now and patient is going to arrange. 5. DEXA 2018.  Patient  followed by her primary physician for this and will continue to follow-up with them in reference to bone health. 6. Health maintenance.  Patient reports routine blood work done elsewhere.  Follow-up 1 year, sooner as needed.   [name], 4:20 PM 09/17/2017

## 2017-09-17 NOTE — Addendum Note (Signed)
Addended by: Nelva Nay on: 09/17/2017 04:28 PM   Modules accepted: Orders

## 2017-09-18 LAB — PAP IG W/ RFLX HPV ASCU

## 2018-03-17 ENCOUNTER — Other Ambulatory Visit: Payer: Self-pay | Admitting: Internal Medicine

## 2018-03-17 DIAGNOSIS — Z1231 Encounter for screening mammogram for malignant neoplasm of breast: Secondary | ICD-10-CM

## 2018-04-21 ENCOUNTER — Ambulatory Visit
Admission: RE | Admit: 2018-04-21 | Discharge: 2018-04-21 | Disposition: A | Discharge status: Home / Self Care | Payer: BLUE CROSS/BLUE SHIELD | Source: Ambulatory Visit | Attending: Internal Medicine | Admitting: Internal Medicine

## 2018-04-21 DIAGNOSIS — Z1231 Encounter for screening mammogram for malignant neoplasm of breast: Secondary | ICD-10-CM | POA: Diagnosis not present

## 2018-07-02 DIAGNOSIS — I10 Essential (primary) hypertension: Secondary | ICD-10-CM | POA: Diagnosis not present

## 2018-07-02 DIAGNOSIS — E7849 Other hyperlipidemia: Secondary | ICD-10-CM | POA: Diagnosis not present

## 2018-07-02 DIAGNOSIS — R82998 Other abnormal findings in urine: Secondary | ICD-10-CM | POA: Diagnosis not present

## 2018-07-09 DIAGNOSIS — Z0000 Encounter for general adult medical examination without abnormal findings: Secondary | ICD-10-CM | POA: Diagnosis not present

## 2018-07-09 DIAGNOSIS — E785 Hyperlipidemia, unspecified: Secondary | ICD-10-CM | POA: Diagnosis not present

## 2018-07-09 DIAGNOSIS — J309 Allergic rhinitis, unspecified: Secondary | ICD-10-CM | POA: Diagnosis not present

## 2018-07-09 DIAGNOSIS — Z1331 Encounter for screening for depression: Secondary | ICD-10-CM | POA: Diagnosis not present

## 2018-07-09 DIAGNOSIS — I10 Essential (primary) hypertension: Secondary | ICD-10-CM | POA: Diagnosis not present

## 2018-07-09 DIAGNOSIS — M170 Bilateral primary osteoarthritis of knee: Secondary | ICD-10-CM | POA: Diagnosis not present

## 2018-09-20 ENCOUNTER — Encounter: Payer: BLUE CROSS/BLUE SHIELD | Admitting: Gynecology

## 2018-11-04 ENCOUNTER — Other Ambulatory Visit: Payer: Self-pay

## 2018-11-05 ENCOUNTER — Encounter: Payer: Self-pay | Admitting: Gynecology

## 2018-11-05 ENCOUNTER — Ambulatory Visit: Payer: BC Managed Care – PPO | Admitting: Gynecology

## 2018-11-05 VITALS — BP 118/74 | Ht 62.0 in | Wt 160.0 lb

## 2018-11-05 DIAGNOSIS — Z01419 Encounter for gynecological examination (general) (routine) without abnormal findings: Secondary | ICD-10-CM

## 2018-11-05 DIAGNOSIS — Z853 Personal history of malignant neoplasm of breast: Secondary | ICD-10-CM | POA: Diagnosis not present

## 2018-11-05 DIAGNOSIS — N952 Postmenopausal atrophic vaginitis: Secondary | ICD-10-CM | POA: Diagnosis not present

## 2018-11-05 NOTE — Patient Instructions (Signed)
Follow-up in 1 year for annual exam, sooner if any issues. 

## 2018-11-05 NOTE — Progress Notes (Signed)
    Carrie Vaughn [facility] Jul 19, 1955 [id number]        62 y.o.  G1P1001 for annual gynecologic exam.  Without gynecologic complaints  Past medical history,surgical history, problem list, medications, allergies, family history and social history were all reviewed and documented as reviewed in the EPIC chart.  ROS:  Performed with pertinent positives and negatives included in the history, assessment and plan.   Additional significant findings : None   Exam: [name] Vitals:   11/05/18 1427  BP: 118/74  Weight: 160 lb (72.6 kg)  Height: 5\' 2"  (1.575 m)   Body mass index is 29.26 kg/m.  General appearance:  Normal affect, orientation and appearance. Skin: Grossly normal HEENT: Without gross lesions.  No cervical or supraclavicular adenopathy. Thyroid normal.  Lungs:  Clear without wheezing, rales or rhonchi Cardiac: RR, without RMG Abdominal:  Soft, nontender, without masses, guarding, rebound, organomegaly or hernia Breasts:  Examined lying and sitting without masses, retractions, discharge or axillary adenopathy. Pelvic:  Ext, BUS, Vagina: Normal with atrophic changes  Cervix: Normal with atrophic changes  Uterus: Anteverted, normal size, shape and contour, midline and mobile nontender   Adnexa: Without masses or tenderness    Anus and perineum: Normal   Rectovaginal: Normal sphincter tone without palpated masses or tenderness.    Assessment/Plan:  63 y.o. G52P1001 female for annual gynecologic exam.   1. Postmenopausal.  No significant menopausal symptoms or any vaginal bleeding. 2. History of right breast cancer status post lumpectomy and radiation.  Exam NED.  Mammography 04/2018.  Continue with annual mammography when due. 3. Pap smear 2019.  No Pap smear done today.  History LGSIL 2008 with normal Pap smears since.  Plan repeat Pap smear at 3-year interval per current screening guidelines. 4. Colonoscopy 2009.  Reminded patient she is overdue and she is going  to call and schedule. 5. DEXA reported 2018.  Followed by her primary physician for this. 6. Health maintenance.  No routine lab work done as patient does this elsewhere.  Follow-up 1 year, sooner as needed.   [name], 2:43 PM 11/05/2018

## 2018-11-10 DIAGNOSIS — M25561 Pain in right knee: Secondary | ICD-10-CM | POA: Diagnosis not present

## 2018-11-10 DIAGNOSIS — M1811 Unilateral primary osteoarthritis of first carpometacarpal joint, right hand: Secondary | ICD-10-CM | POA: Diagnosis not present

## 2018-11-10 DIAGNOSIS — M2550 Pain in unspecified joint: Secondary | ICD-10-CM | POA: Diagnosis not present

## 2018-11-10 DIAGNOSIS — M79642 Pain in left hand: Secondary | ICD-10-CM | POA: Diagnosis not present

## 2018-11-10 DIAGNOSIS — R768 Other specified abnormal immunological findings in serum: Secondary | ICD-10-CM | POA: Diagnosis not present

## 2018-11-10 DIAGNOSIS — M1712 Unilateral primary osteoarthritis, left knee: Secondary | ICD-10-CM | POA: Diagnosis not present

## 2018-11-10 DIAGNOSIS — M170 Bilateral primary osteoarthritis of knee: Secondary | ICD-10-CM | POA: Diagnosis not present

## 2018-11-10 DIAGNOSIS — M79641 Pain in right hand: Secondary | ICD-10-CM | POA: Diagnosis not present

## 2018-11-10 DIAGNOSIS — M25562 Pain in left knee: Secondary | ICD-10-CM | POA: Diagnosis not present

## 2018-11-10 DIAGNOSIS — S83141A Lateral subluxation of proximal end of tibia, right knee, initial encounter: Secondary | ICD-10-CM | POA: Diagnosis not present

## 2018-11-10 DIAGNOSIS — M1711 Unilateral primary osteoarthritis, right knee: Secondary | ICD-10-CM | POA: Diagnosis not present

## 2018-11-10 DIAGNOSIS — M1812 Unilateral primary osteoarthritis of first carpometacarpal joint, left hand: Secondary | ICD-10-CM | POA: Diagnosis not present

## 2018-11-10 DIAGNOSIS — M25569 Pain in unspecified knee: Secondary | ICD-10-CM | POA: Diagnosis not present

## 2018-11-11 DIAGNOSIS — M170 Bilateral primary osteoarthritis of knee: Secondary | ICD-10-CM | POA: Diagnosis not present

## 2018-11-11 DIAGNOSIS — D8989 Other specified disorders involving the immune mechanism, not elsewhere classified: Secondary | ICD-10-CM | POA: Diagnosis not present

## 2018-12-03 DIAGNOSIS — R768 Other specified abnormal immunological findings in serum: Secondary | ICD-10-CM | POA: Diagnosis not present

## 2018-12-03 DIAGNOSIS — M2550 Pain in unspecified joint: Secondary | ICD-10-CM | POA: Diagnosis not present

## 2018-12-03 DIAGNOSIS — M170 Bilateral primary osteoarthritis of knee: Secondary | ICD-10-CM | POA: Diagnosis not present

## 2018-12-03 DIAGNOSIS — M25569 Pain in unspecified knee: Secondary | ICD-10-CM | POA: Diagnosis not present

## 2018-12-07 ENCOUNTER — Encounter: Payer: Self-pay | Admitting: Gynecology

## 2019-01-06 DIAGNOSIS — Z23 Encounter for immunization: Secondary | ICD-10-CM | POA: Diagnosis not present

## 2019-03-14 ENCOUNTER — Other Ambulatory Visit: Payer: Self-pay | Admitting: Internal Medicine

## 2019-03-14 DIAGNOSIS — Z1231 Encounter for screening mammogram for malignant neoplasm of breast: Secondary | ICD-10-CM

## 2019-04-26 ENCOUNTER — Ambulatory Visit
Admission: RE | Admit: 2019-04-26 | Discharge: 2019-04-26 | Disposition: A | Discharge status: Home / Self Care | Payer: BLUE CROSS/BLUE SHIELD | Source: Ambulatory Visit | Attending: Internal Medicine | Admitting: Internal Medicine

## 2019-04-26 ENCOUNTER — Other Ambulatory Visit: Payer: Self-pay

## 2019-04-26 DIAGNOSIS — Z1231 Encounter for screening mammogram for malignant neoplasm of breast: Secondary | ICD-10-CM

## 2019-04-26 IMAGING — MG DIGITAL SCREENING BILAT W/ TOMO W/ CAD
6 of 10 series · 6 of 30 positions shown · non-contrast
Comparison: Previous exam(s).

CLINICAL DATA: Screening.

EXAM:
DIGITAL SCREENING BILATERAL MAMMOGRAM WITH TOMO AND CAD

[L MLO synth-2D (1 of 2)]
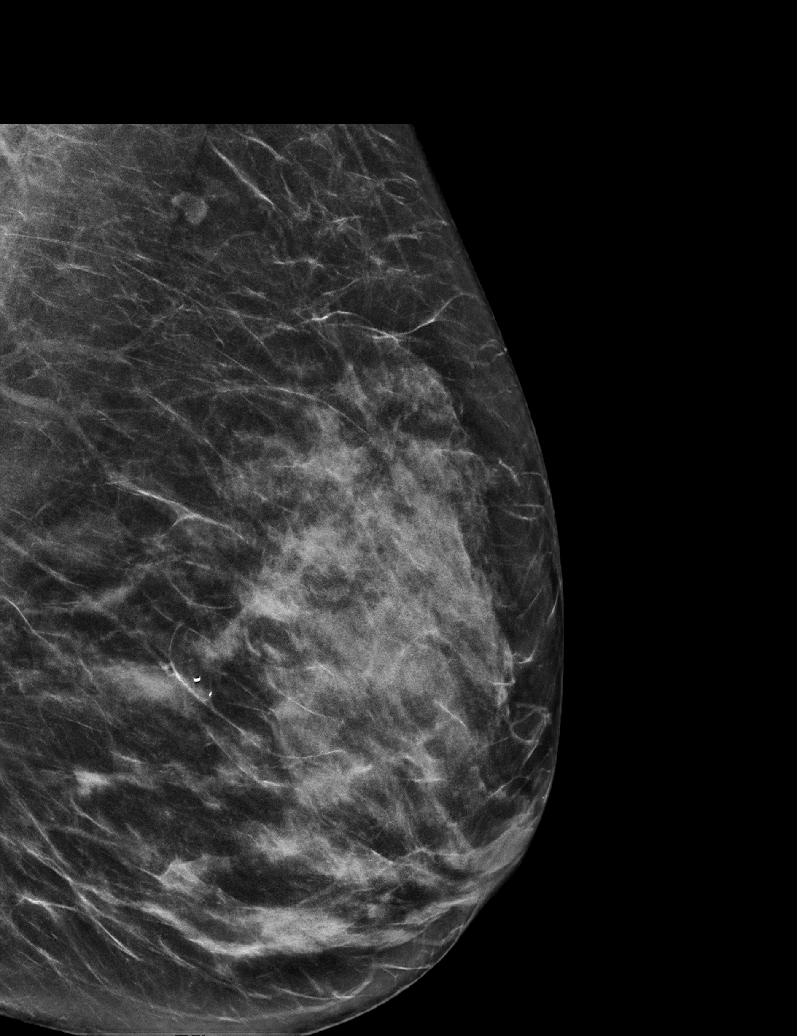

[R MLO synth-2D]
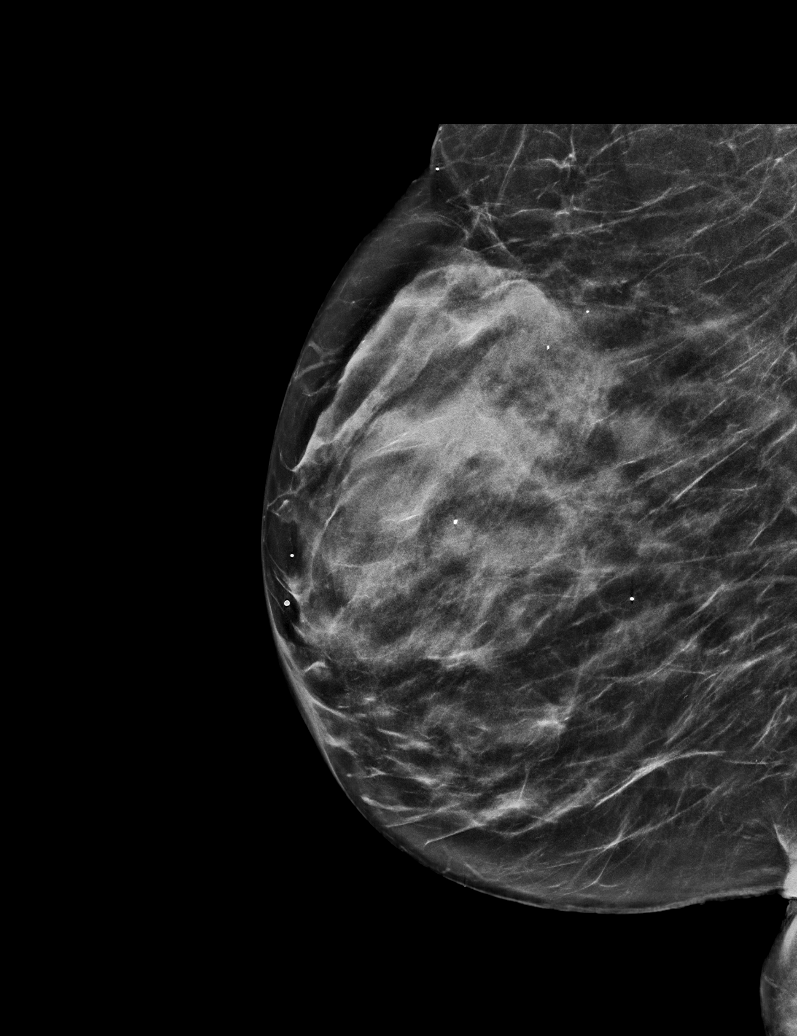

[L MLO synth-2D (2 of 2)]
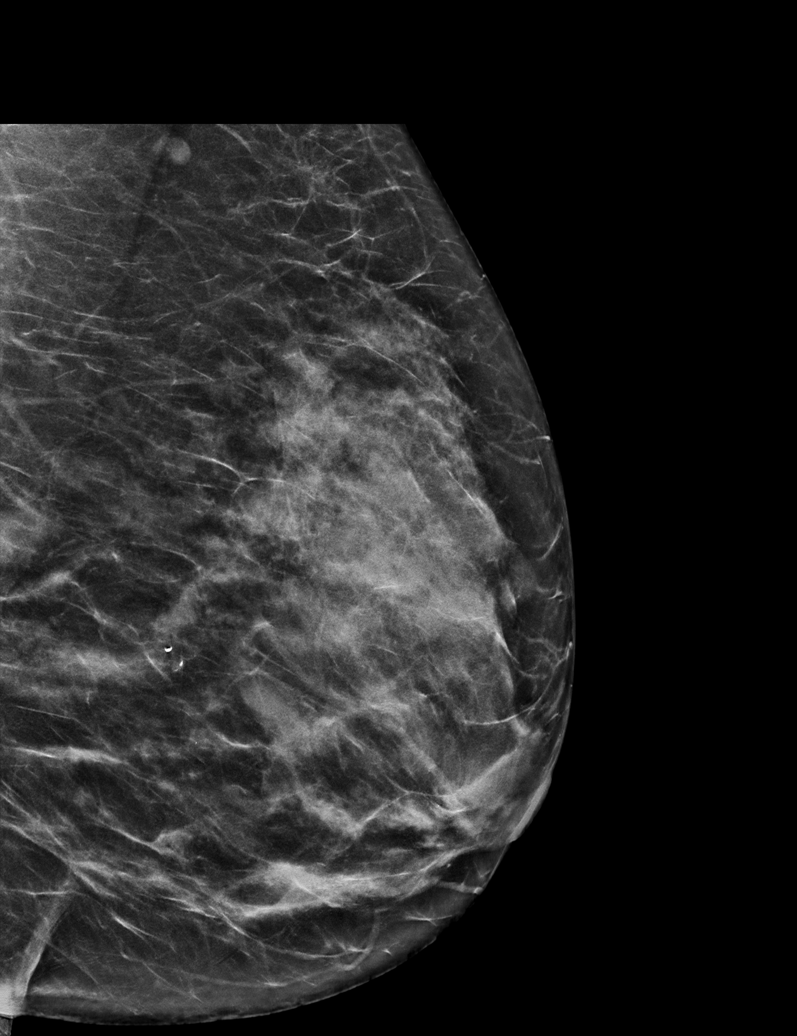

[L CC synth-2D]
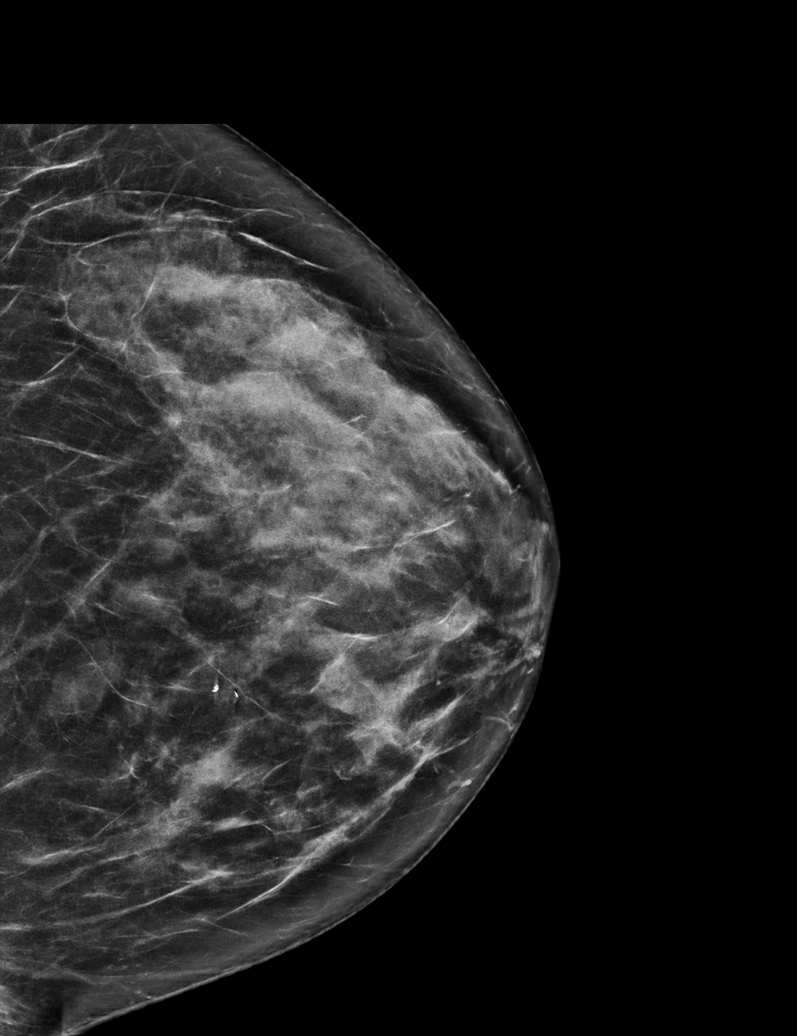

[R CC synth-2D]
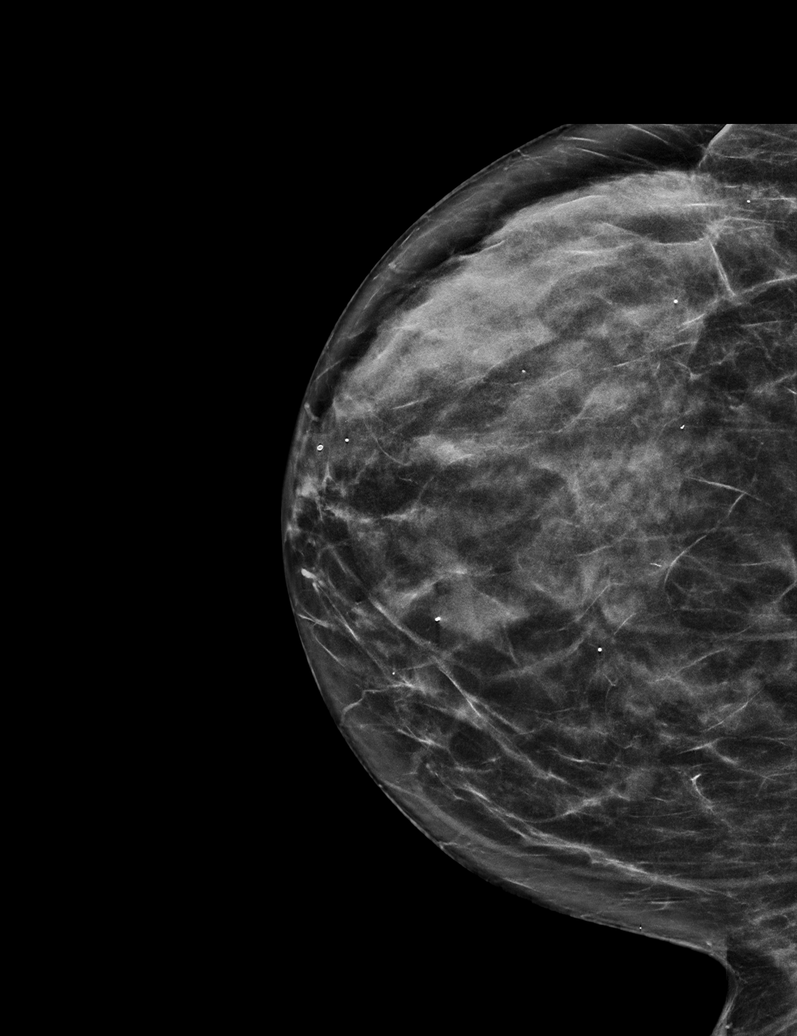

[L MLO tomo · tomo slice 31/62.0]
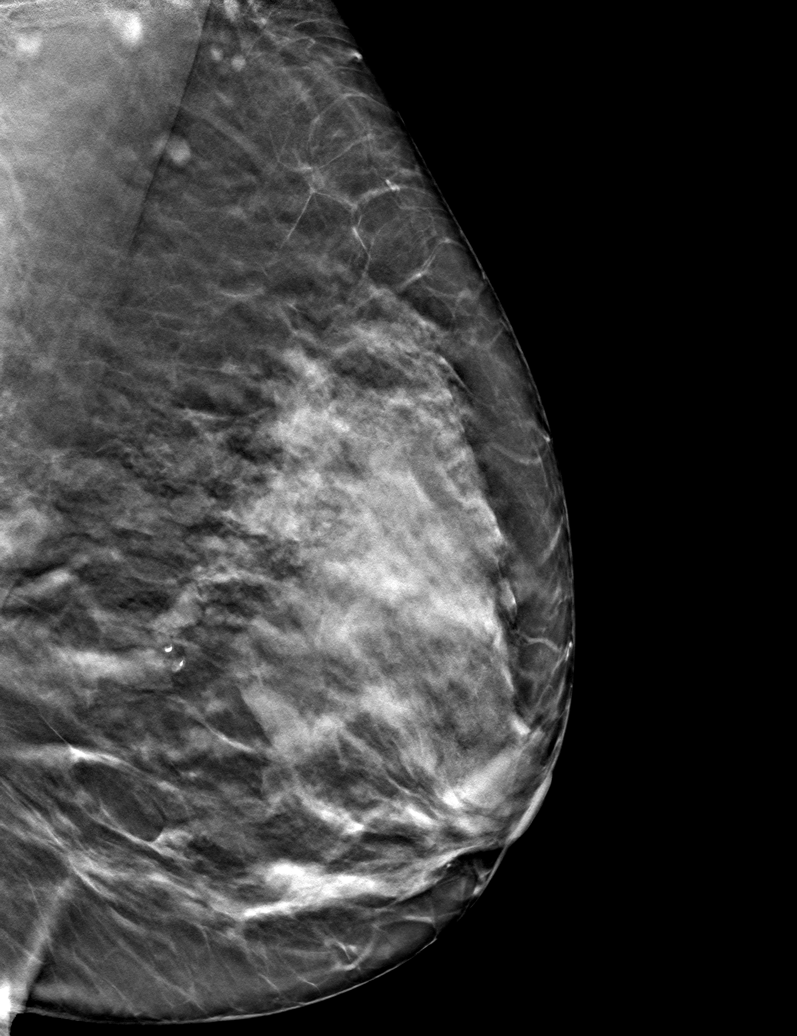

[6 of 30 positions shown; findings below may reference images not displayed]

ACR Breast Density Category d: The breast tissue is extremely dense,
which lowers the sensitivity of mammography
FINDINGS: There are no findings suspicious for malignancy. Images were
processed with CAD.
IMPRESSION: No mammographic evidence of malignancy. A result letter of this
screening mammogram will be mailed directly to the patient.

RECOMMENDATION:
Screening mammogram in one year. (Code:[5I])

BI-RADS CATEGORY  1: Negative.

## 2019-05-19 ENCOUNTER — Ambulatory Visit: Payer: BLUE CROSS/BLUE SHIELD | Attending: Internal Medicine

## 2019-05-19 DIAGNOSIS — Z23 Encounter for immunization: Secondary | ICD-10-CM

## 2019-05-19 NOTE — Progress Notes (Signed)
   Covid-19 Vaccination Clinic  Name:  Rinka Jersey    MRN: [id number] DOB: 1955/10/12  05/19/2019  Ms. Brunke was observed post Covid-19 immunization for 15 minutes without incident. She was provided with Vaccine Information Sheet and instruction to access the V-Safe system.   Ms. Bach was instructed to call 911 with any severe reactions post vaccine: Marland Kitchen Difficulty breathing  . Swelling of face and throat  . A fast heartbeat  . A bad rash all over body  . Dizziness and weakness   Immunizations Administered    Name Date Dose VIS Date Route   Pfizer COVID-19 Vaccine 05/19/2019  2:54 PM 0.3 mL 02/18/2019 Intramuscular   Manufacturer: [place]   Lot: [id number]   [place]: [id number]

## 2019-06-13 ENCOUNTER — Ambulatory Visit: Payer: BLUE CROSS/BLUE SHIELD | Attending: Internal Medicine

## 2019-06-13 DIAGNOSIS — Z23 Encounter for immunization: Secondary | ICD-10-CM

## 2019-06-13 NOTE — Progress Notes (Signed)
   [facility]ination [facility]  Name:  Carrie Vaughn    MRN: [id number] DOB: Jan 19, 1956  06/13/2019  [name] was observed post Covid-19 immunization for 15 minutes without incident. She was provided with Vaccine Information Sheet and instruction to access the V-Safe system.   Ms. South was instructed to call 911 with any severe reactions post vaccine: Marland Kitchen Difficulty breathing  . Swelling of face and throat  . A fast heartbeat  . A bad rash all over body  . Dizziness and weakness   Immunizations Administered    Name Date Dose VIS Date Route   Pfizer COVID-19 Vaccine 06/13/2019 10:42 AM 0.3 mL 02/18/2019 Intramuscular   Manufacturer: [place]   Lot: [id number]   [place]: [id number]

## 2020-08-24 ENCOUNTER — Other Ambulatory Visit: Payer: Self-pay | Admitting: Internal Medicine

## 2020-08-24 DIAGNOSIS — Z1231 Encounter for screening mammogram for malignant neoplasm of breast: Secondary | ICD-10-CM

## 2021-01-03 ENCOUNTER — Other Ambulatory Visit: Payer: Self-pay | Admitting: Internal Medicine

## 2021-01-03 DIAGNOSIS — Z1231 Encounter for screening mammogram for malignant neoplasm of breast: Secondary | ICD-10-CM

## 2021-10-04 ENCOUNTER — Other Ambulatory Visit: Payer: Self-pay | Admitting: Internal Medicine

## 2021-10-04 DIAGNOSIS — E785 Hyperlipidemia, unspecified: Secondary | ICD-10-CM

## 2021-12-25 ENCOUNTER — Ambulatory Visit
Admission: RE | Admit: 2021-12-25 | Discharge: 2021-12-25 | Disposition: A | Discharge status: Home / Self Care | Payer: No Typology Code available for payment source | Source: Ambulatory Visit | Attending: Internal Medicine | Admitting: Internal Medicine

## 2021-12-25 DIAGNOSIS — E785 Hyperlipidemia, unspecified: Secondary | ICD-10-CM

## 2022-03-17 ENCOUNTER — Other Ambulatory Visit: Payer: Self-pay | Admitting: Internal Medicine

## 2022-03-17 DIAGNOSIS — D649 Anemia, unspecified: Secondary | ICD-10-CM

## 2022-03-17 DIAGNOSIS — E785 Hyperlipidemia, unspecified: Secondary | ICD-10-CM | POA: Diagnosis not present

## 2022-03-17 DIAGNOSIS — J309 Allergic rhinitis, unspecified: Secondary | ICD-10-CM | POA: Diagnosis not present

## 2022-03-17 DIAGNOSIS — M170 Bilateral primary osteoarthritis of knee: Secondary | ICD-10-CM | POA: Diagnosis not present

## 2022-03-17 DIAGNOSIS — I10 Essential (primary) hypertension: Secondary | ICD-10-CM | POA: Diagnosis not present

## 2022-03-18 DIAGNOSIS — D649 Anemia, unspecified: Secondary | ICD-10-CM | POA: Diagnosis not present

## 2022-03-20 ENCOUNTER — Other Ambulatory Visit: Payer: Self-pay | Admitting: Internal Medicine

## 2022-03-20 DIAGNOSIS — Z1231 Encounter for screening mammogram for malignant neoplasm of breast: Secondary | ICD-10-CM

## 2022-05-13 ENCOUNTER — Ambulatory Visit
Admission: RE | Admit: 2022-05-13 | Discharge: 2022-05-13 | Disposition: A | Discharge status: Home / Self Care | Payer: PPO | Source: Ambulatory Visit | Attending: Internal Medicine | Admitting: Internal Medicine

## 2022-05-13 DIAGNOSIS — Z1231 Encounter for screening mammogram for malignant neoplasm of breast: Secondary | ICD-10-CM | POA: Diagnosis not present

## 2022-05-26 ENCOUNTER — Other Ambulatory Visit: Payer: Self-pay | Admitting: Internal Medicine

## 2022-05-26 DIAGNOSIS — R921 Mammographic calcification found on diagnostic imaging of breast: Secondary | ICD-10-CM

## 2022-06-04 ENCOUNTER — Other Ambulatory Visit: Payer: Self-pay | Admitting: Internal Medicine

## 2022-06-04 DIAGNOSIS — R928 Other abnormal and inconclusive findings on diagnostic imaging of breast: Secondary | ICD-10-CM

## 2022-06-11 ENCOUNTER — Ambulatory Visit
Admission: RE | Admit: 2022-06-11 | Discharge: 2022-06-11 | Disposition: A | Discharge status: Home / Self Care | Payer: PPO | Source: Ambulatory Visit | Attending: Internal Medicine | Admitting: Internal Medicine

## 2022-06-11 DIAGNOSIS — R928 Other abnormal and inconclusive findings on diagnostic imaging of breast: Secondary | ICD-10-CM

## 2022-06-11 DIAGNOSIS — R921 Mammographic calcification found on diagnostic imaging of breast: Secondary | ICD-10-CM | POA: Diagnosis not present

## 2022-07-10 DIAGNOSIS — M170 Bilateral primary osteoarthritis of knee: Secondary | ICD-10-CM | POA: Diagnosis not present

## 2022-07-10 DIAGNOSIS — M1711 Unilateral primary osteoarthritis, right knee: Secondary | ICD-10-CM | POA: Diagnosis not present

## 2022-07-10 DIAGNOSIS — M1712 Unilateral primary osteoarthritis, left knee: Secondary | ICD-10-CM | POA: Diagnosis not present

## 2022-09-09 DIAGNOSIS — J309 Allergic rhinitis, unspecified: Secondary | ICD-10-CM | POA: Diagnosis not present

## 2022-09-09 DIAGNOSIS — M170 Bilateral primary osteoarthritis of knee: Secondary | ICD-10-CM | POA: Diagnosis not present

## 2022-09-09 DIAGNOSIS — I10 Essential (primary) hypertension: Secondary | ICD-10-CM | POA: Diagnosis not present

## 2022-09-09 DIAGNOSIS — Z01818 Encounter for other preprocedural examination: Secondary | ICD-10-CM | POA: Diagnosis not present

## 2022-09-09 DIAGNOSIS — E785 Hyperlipidemia, unspecified: Secondary | ICD-10-CM | POA: Diagnosis not present

## 2022-09-09 DIAGNOSIS — D649 Anemia, unspecified: Secondary | ICD-10-CM | POA: Diagnosis not present

## 2022-09-16 DIAGNOSIS — Z1212 Encounter for screening for malignant neoplasm of rectum: Secondary | ICD-10-CM | POA: Diagnosis not present

## 2022-09-16 DIAGNOSIS — D649 Anemia, unspecified: Secondary | ICD-10-CM | POA: Diagnosis not present

## 2022-09-16 DIAGNOSIS — E785 Hyperlipidemia, unspecified: Secondary | ICD-10-CM | POA: Diagnosis not present

## 2022-09-16 DIAGNOSIS — I10 Essential (primary) hypertension: Secondary | ICD-10-CM | POA: Diagnosis not present

## 2022-09-17 DIAGNOSIS — M170 Bilateral primary osteoarthritis of knee: Secondary | ICD-10-CM | POA: Diagnosis not present

## 2022-10-06 DIAGNOSIS — R262 Difficulty in walking, not elsewhere classified: Secondary | ICD-10-CM | POA: Diagnosis not present

## 2022-10-06 DIAGNOSIS — M1731 Unilateral post-traumatic osteoarthritis, right knee: Secondary | ICD-10-CM | POA: Diagnosis not present

## 2022-10-21 DIAGNOSIS — R82998 Other abnormal findings in urine: Secondary | ICD-10-CM | POA: Diagnosis not present

## 2022-10-21 DIAGNOSIS — M170 Bilateral primary osteoarthritis of knee: Secondary | ICD-10-CM | POA: Diagnosis not present

## 2022-10-21 DIAGNOSIS — Z01818 Encounter for other preprocedural examination: Secondary | ICD-10-CM | POA: Diagnosis not present

## 2022-10-21 DIAGNOSIS — E785 Hyperlipidemia, unspecified: Secondary | ICD-10-CM | POA: Diagnosis not present

## 2022-10-21 DIAGNOSIS — I10 Essential (primary) hypertension: Secondary | ICD-10-CM | POA: Diagnosis not present

## 2022-10-21 DIAGNOSIS — D649 Anemia, unspecified: Secondary | ICD-10-CM | POA: Diagnosis not present

## 2022-10-21 DIAGNOSIS — Z0000 Encounter for general adult medical examination without abnormal findings: Secondary | ICD-10-CM | POA: Diagnosis not present

## 2022-10-21 DIAGNOSIS — Z23 Encounter for immunization: Secondary | ICD-10-CM | POA: Diagnosis not present

## 2022-10-21 DIAGNOSIS — J309 Allergic rhinitis, unspecified: Secondary | ICD-10-CM | POA: Diagnosis not present

## 2022-11-12 DIAGNOSIS — M1711 Unilateral primary osteoarthritis, right knee: Secondary | ICD-10-CM | POA: Diagnosis not present

## 2022-11-28 DIAGNOSIS — M21161 Varus deformity, not elsewhere classified, right knee: Secondary | ICD-10-CM | POA: Diagnosis not present

## 2022-11-28 DIAGNOSIS — Z96651 Presence of right artificial knee joint: Secondary | ICD-10-CM | POA: Diagnosis not present

## 2022-11-28 DIAGNOSIS — M25761 Osteophyte, right knee: Secondary | ICD-10-CM | POA: Diagnosis not present

## 2022-11-28 DIAGNOSIS — G8918 Other acute postprocedural pain: Secondary | ICD-10-CM | POA: Diagnosis not present

## 2022-11-28 DIAGNOSIS — M1711 Unilateral primary osteoarthritis, right knee: Secondary | ICD-10-CM | POA: Diagnosis not present

## 2022-12-01 DIAGNOSIS — M25561 Pain in right knee: Secondary | ICD-10-CM | POA: Diagnosis not present

## 2022-12-01 DIAGNOSIS — R262 Difficulty in walking, not elsewhere classified: Secondary | ICD-10-CM | POA: Diagnosis not present

## 2022-12-01 DIAGNOSIS — M25661 Stiffness of right knee, not elsewhere classified: Secondary | ICD-10-CM | POA: Diagnosis not present

## 2022-12-03 DIAGNOSIS — R262 Difficulty in walking, not elsewhere classified: Secondary | ICD-10-CM | POA: Diagnosis not present

## 2022-12-03 DIAGNOSIS — M25561 Pain in right knee: Secondary | ICD-10-CM | POA: Diagnosis not present

## 2022-12-03 DIAGNOSIS — M25661 Stiffness of right knee, not elsewhere classified: Secondary | ICD-10-CM | POA: Diagnosis not present

## 2022-12-08 DIAGNOSIS — M25561 Pain in right knee: Secondary | ICD-10-CM | POA: Diagnosis not present

## 2022-12-08 DIAGNOSIS — R262 Difficulty in walking, not elsewhere classified: Secondary | ICD-10-CM | POA: Diagnosis not present

## 2022-12-08 DIAGNOSIS — M25661 Stiffness of right knee, not elsewhere classified: Secondary | ICD-10-CM | POA: Diagnosis not present

## 2022-12-09 DIAGNOSIS — M25561 Pain in right knee: Secondary | ICD-10-CM | POA: Diagnosis not present

## 2022-12-10 DIAGNOSIS — M25661 Stiffness of right knee, not elsewhere classified: Secondary | ICD-10-CM | POA: Diagnosis not present

## 2022-12-10 DIAGNOSIS — M25561 Pain in right knee: Secondary | ICD-10-CM | POA: Diagnosis not present

## 2022-12-10 DIAGNOSIS — R262 Difficulty in walking, not elsewhere classified: Secondary | ICD-10-CM | POA: Diagnosis not present

## 2022-12-15 DIAGNOSIS — R262 Difficulty in walking, not elsewhere classified: Secondary | ICD-10-CM | POA: Diagnosis not present

## 2022-12-15 DIAGNOSIS — M25561 Pain in right knee: Secondary | ICD-10-CM | POA: Diagnosis not present

## 2022-12-15 DIAGNOSIS — M25661 Stiffness of right knee, not elsewhere classified: Secondary | ICD-10-CM | POA: Diagnosis not present

## 2022-12-19 DIAGNOSIS — M25561 Pain in right knee: Secondary | ICD-10-CM | POA: Diagnosis not present

## 2022-12-19 DIAGNOSIS — M25661 Stiffness of right knee, not elsewhere classified: Secondary | ICD-10-CM | POA: Diagnosis not present

## 2022-12-19 DIAGNOSIS — R262 Difficulty in walking, not elsewhere classified: Secondary | ICD-10-CM | POA: Diagnosis not present

## 2022-12-23 DIAGNOSIS — R262 Difficulty in walking, not elsewhere classified: Secondary | ICD-10-CM | POA: Diagnosis not present

## 2022-12-23 DIAGNOSIS — M25561 Pain in right knee: Secondary | ICD-10-CM | POA: Diagnosis not present

## 2022-12-23 DIAGNOSIS — M25661 Stiffness of right knee, not elsewhere classified: Secondary | ICD-10-CM | POA: Diagnosis not present

## 2022-12-25 DIAGNOSIS — R262 Difficulty in walking, not elsewhere classified: Secondary | ICD-10-CM | POA: Diagnosis not present

## 2022-12-25 DIAGNOSIS — M25561 Pain in right knee: Secondary | ICD-10-CM | POA: Diagnosis not present

## 2022-12-25 DIAGNOSIS — M25661 Stiffness of right knee, not elsewhere classified: Secondary | ICD-10-CM | POA: Diagnosis not present

## 2022-12-29 DIAGNOSIS — M25561 Pain in right knee: Secondary | ICD-10-CM | POA: Diagnosis not present

## 2022-12-29 DIAGNOSIS — R262 Difficulty in walking, not elsewhere classified: Secondary | ICD-10-CM | POA: Diagnosis not present

## 2022-12-29 DIAGNOSIS — M25661 Stiffness of right knee, not elsewhere classified: Secondary | ICD-10-CM | POA: Diagnosis not present

## 2023-01-01 DIAGNOSIS — R262 Difficulty in walking, not elsewhere classified: Secondary | ICD-10-CM | POA: Diagnosis not present

## 2023-01-01 DIAGNOSIS — M25661 Stiffness of right knee, not elsewhere classified: Secondary | ICD-10-CM | POA: Diagnosis not present

## 2023-01-01 DIAGNOSIS — M25561 Pain in right knee: Secondary | ICD-10-CM | POA: Diagnosis not present

## 2023-01-03 DIAGNOSIS — Z23 Encounter for immunization: Secondary | ICD-10-CM | POA: Diagnosis not present

## 2023-01-05 DIAGNOSIS — M25661 Stiffness of right knee, not elsewhere classified: Secondary | ICD-10-CM | POA: Diagnosis not present

## 2023-01-05 DIAGNOSIS — R262 Difficulty in walking, not elsewhere classified: Secondary | ICD-10-CM | POA: Diagnosis not present

## 2023-01-05 DIAGNOSIS — M25561 Pain in right knee: Secondary | ICD-10-CM | POA: Diagnosis not present

## 2023-01-07 DIAGNOSIS — M25561 Pain in right knee: Secondary | ICD-10-CM | POA: Diagnosis not present

## 2023-01-07 DIAGNOSIS — M25661 Stiffness of right knee, not elsewhere classified: Secondary | ICD-10-CM | POA: Diagnosis not present

## 2023-01-07 DIAGNOSIS — R262 Difficulty in walking, not elsewhere classified: Secondary | ICD-10-CM | POA: Diagnosis not present

## 2023-04-17 DIAGNOSIS — M170 Bilateral primary osteoarthritis of knee: Secondary | ICD-10-CM | POA: Diagnosis not present

## 2023-04-17 DIAGNOSIS — J309 Allergic rhinitis, unspecified: Secondary | ICD-10-CM | POA: Diagnosis not present

## 2023-04-17 DIAGNOSIS — Z23 Encounter for immunization: Secondary | ICD-10-CM | POA: Diagnosis not present

## 2023-04-17 DIAGNOSIS — D649 Anemia, unspecified: Secondary | ICD-10-CM | POA: Diagnosis not present

## 2023-04-17 DIAGNOSIS — I10 Essential (primary) hypertension: Secondary | ICD-10-CM | POA: Diagnosis not present

## 2023-04-17 DIAGNOSIS — E785 Hyperlipidemia, unspecified: Secondary | ICD-10-CM | POA: Diagnosis not present

## 2023-05-12 DIAGNOSIS — M170 Bilateral primary osteoarthritis of knee: Secondary | ICD-10-CM | POA: Diagnosis not present

## 2023-05-12 DIAGNOSIS — M1711 Unilateral primary osteoarthritis, right knee: Secondary | ICD-10-CM | POA: Diagnosis not present

## 2023-10-21 DIAGNOSIS — Z1212 Encounter for screening for malignant neoplasm of rectum: Secondary | ICD-10-CM | POA: Diagnosis not present

## 2023-10-21 DIAGNOSIS — E785 Hyperlipidemia, unspecified: Secondary | ICD-10-CM | POA: Diagnosis not present

## 2023-10-21 DIAGNOSIS — D649 Anemia, unspecified: Secondary | ICD-10-CM | POA: Diagnosis not present

## 2023-10-21 DIAGNOSIS — I10 Essential (primary) hypertension: Secondary | ICD-10-CM | POA: Diagnosis not present

## 2023-10-28 DIAGNOSIS — Z1211 Encounter for screening for malignant neoplasm of colon: Secondary | ICD-10-CM | POA: Diagnosis not present

## 2023-10-28 DIAGNOSIS — J309 Allergic rhinitis, unspecified: Secondary | ICD-10-CM | POA: Diagnosis not present

## 2023-10-28 DIAGNOSIS — Z0000 Encounter for general adult medical examination without abnormal findings: Secondary | ICD-10-CM | POA: Diagnosis not present

## 2023-10-28 DIAGNOSIS — R82998 Other abnormal findings in urine: Secondary | ICD-10-CM | POA: Diagnosis not present

## 2023-10-28 DIAGNOSIS — M170 Bilateral primary osteoarthritis of knee: Secondary | ICD-10-CM | POA: Diagnosis not present

## 2023-10-28 DIAGNOSIS — I10 Essential (primary) hypertension: Secondary | ICD-10-CM | POA: Diagnosis not present

## 2023-10-28 DIAGNOSIS — E785 Hyperlipidemia, unspecified: Secondary | ICD-10-CM | POA: Diagnosis not present

## 2023-10-28 DIAGNOSIS — D649 Anemia, unspecified: Secondary | ICD-10-CM | POA: Diagnosis not present

## 2023-12-26 DIAGNOSIS — Z23 Encounter for immunization: Secondary | ICD-10-CM | POA: Diagnosis not present

## 2024-01-15 DIAGNOSIS — Z1211 Encounter for screening for malignant neoplasm of colon: Secondary | ICD-10-CM | POA: Diagnosis not present

## 2024-01-28 ENCOUNTER — Other Ambulatory Visit: Payer: Self-pay | Admitting: Internal Medicine

## 2024-01-28 DIAGNOSIS — R921 Mammographic calcification found on diagnostic imaging of breast: Secondary | ICD-10-CM

## 2024-02-16 ENCOUNTER — Ambulatory Visit
Admission: RE | Admit: 2024-02-16 | Discharge: 2024-02-16 | Disposition: A | Source: Ambulatory Visit | Attending: Internal Medicine | Admitting: Internal Medicine

## 2024-02-16 DIAGNOSIS — R921 Mammographic calcification found on diagnostic imaging of breast: Secondary | ICD-10-CM
